# Patient Record
Sex: Female | Born: 1990
Health system: Southern US, Community
[De-identification: ages and names within clinical notes are randomized; demographics above are authoritative.]

## PROBLEM LIST (undated history)

## (undated) DIAGNOSIS — R112 Nausea with vomiting, unspecified: Secondary | ICD-10-CM

## (undated) DIAGNOSIS — Z889 Allergy status to unspecified drugs, medicaments and biological substances status: Secondary | ICD-10-CM

## (undated) DIAGNOSIS — R519 Headache, unspecified: Secondary | ICD-10-CM

## (undated) DIAGNOSIS — H9190 Unspecified hearing loss, unspecified ear: Secondary | ICD-10-CM

## (undated) DIAGNOSIS — Q754 Mandibulofacial dysostosis: Secondary | ICD-10-CM

## (undated) DIAGNOSIS — Z9889 Other specified postprocedural states: Secondary | ICD-10-CM

## (undated) HISTORY — PX: EXTERNAL EAR SURGERY: SHX627

## (undated) HISTORY — PX: OTHER SURGICAL HISTORY: SHX169

---
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
## 2008-12-09 ENCOUNTER — Emergency Department (HOSPITAL_COMMUNITY): Admission: EM | Admit: 2008-12-09 | Discharge: 2008-12-09 | Payer: Self-pay | Admitting: Emergency Medicine

## 2009-02-14 ENCOUNTER — Emergency Department (HOSPITAL_COMMUNITY): Admission: EM | Admit: 2009-02-14 | Discharge: 2009-02-14 | Payer: Self-pay | Admitting: Emergency Medicine

## 2009-06-20 ENCOUNTER — Emergency Department (HOSPITAL_COMMUNITY): Admission: EM | Admit: 2009-06-20 | Discharge: 2009-06-20 | Payer: Self-pay | Admitting: Family Medicine

## 2009-07-24 IMAGING — CR DG MASTOID 3+V
4 series · 4 of 4 positions shown · non-contrast
Comparison: none

CLINICAL DATA: Cochlear implant on 04/30/07. Skin has grown over implant.  Warm to touch with redness and swelling.
 MASTOID ? 3 VIEW:

[view not recorded (1 of 4)]
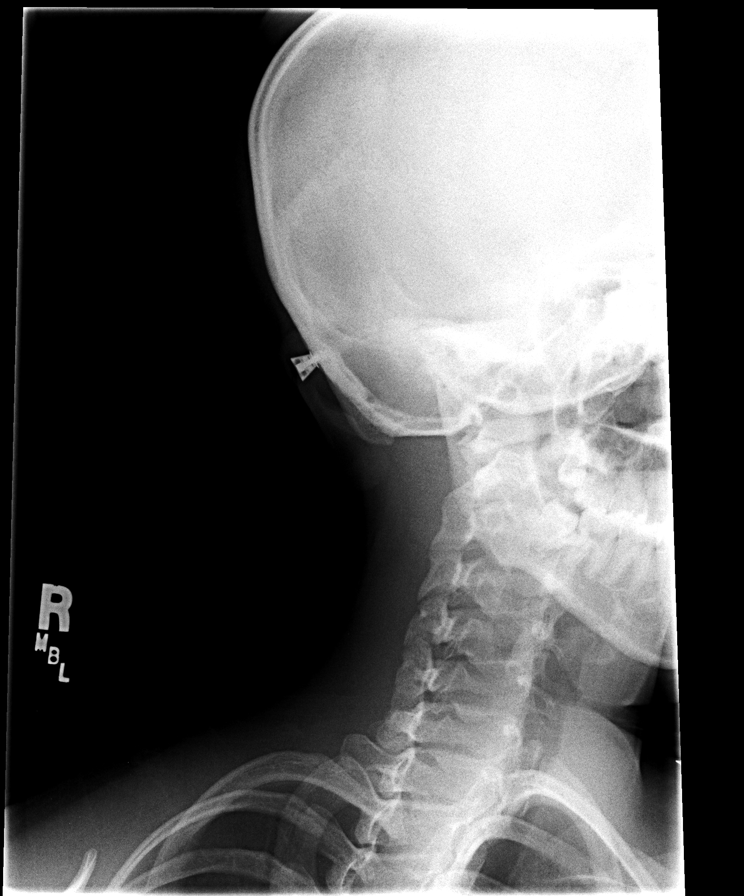

[view not recorded (2 of 4)]
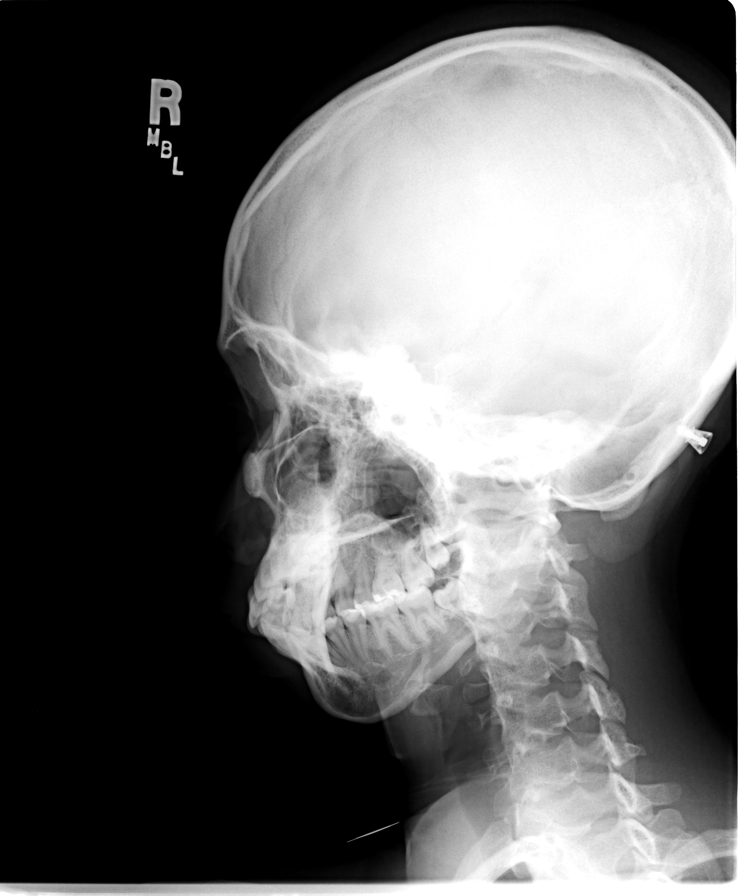

[view not recorded (3 of 4)]
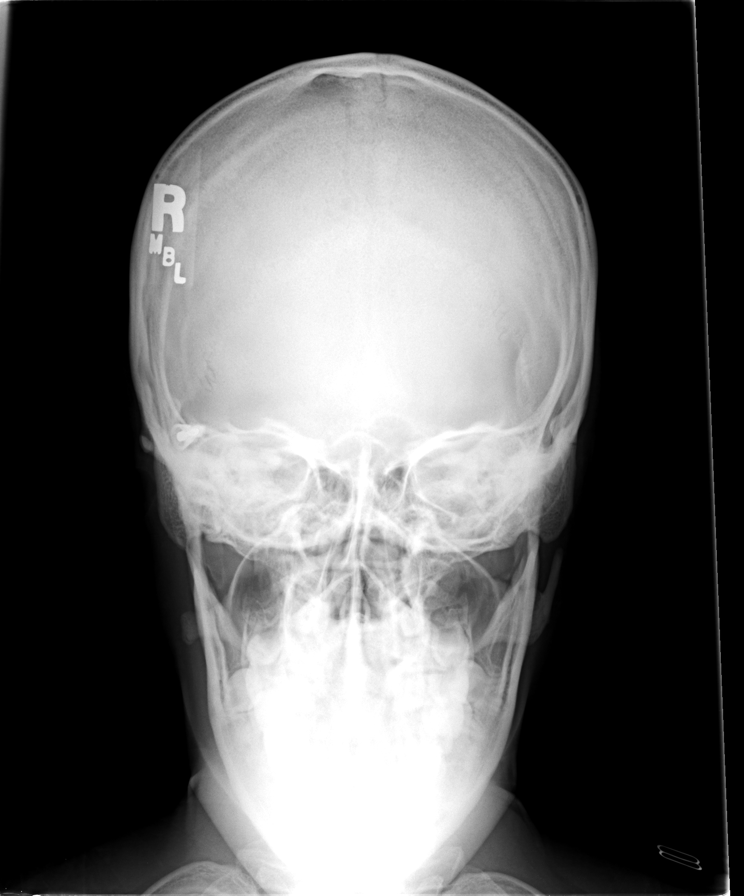

[view not recorded (4 of 4)]
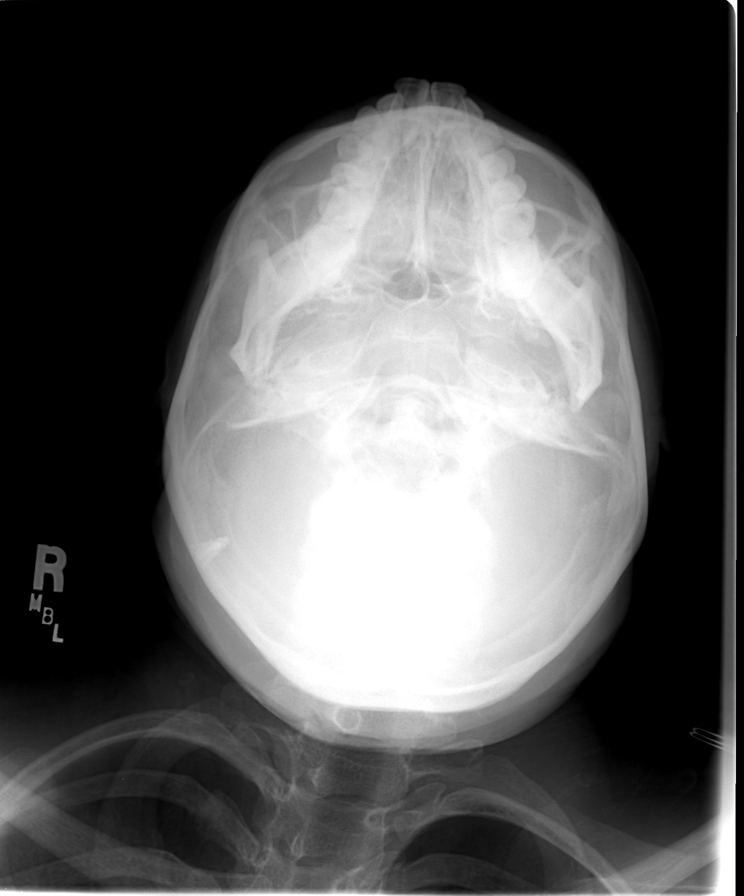

[4 of 4 positions shown; findings below may reference images not displayed]

FINDINGS: A cochlear implant is seen on the right.  No definite osseous erosion to suggest osteomyelitis.
IMPRESSION: Please see above.

## 2010-11-06 ENCOUNTER — Emergency Department (HOSPITAL_COMMUNITY)
Admission: EM | Admit: 2010-11-06 | Discharge: 2010-11-06 | Payer: Self-pay | Source: Home / Self Care | Admitting: Emergency Medicine

## 2011-03-09 LAB — POCT PREGNANCY, URINE: Preg Test, Ur: NEGATIVE

## 2011-03-09 LAB — POCT URINALYSIS DIP (DEVICE)
Protein, ur: NEGATIVE mg/dL
Specific Gravity, Urine: 1.02 (ref 1.005–1.030)
Urobilinogen, UA: 0.2 mg/dL (ref 0.0–1.0)
pH: 5.5 (ref 5.0–8.0)

## 2011-12-29 ENCOUNTER — Other Ambulatory Visit: Payer: Self-pay | Admitting: Specialist

## 2011-12-29 ENCOUNTER — Other Ambulatory Visit: Payer: Self-pay | Admitting: Family Medicine

## 2011-12-29 DIAGNOSIS — R102 Pelvic and perineal pain: Secondary | ICD-10-CM

## 2011-12-29 DIAGNOSIS — N946 Dysmenorrhea, unspecified: Secondary | ICD-10-CM

## 2012-01-02 ENCOUNTER — Ambulatory Visit
Admission: RE | Admit: 2012-01-02 | Discharge: 2012-01-02 | Disposition: A | Discharge status: Home / Self Care | Payer: Medicaid Other | Source: Ambulatory Visit | Attending: Specialist | Admitting: Specialist

## 2012-01-02 ENCOUNTER — Ambulatory Visit: Admission: RE | Admit: 2012-01-02 | Payer: Medicaid Other | Source: Ambulatory Visit

## 2012-01-02 DIAGNOSIS — N946 Dysmenorrhea, unspecified: Secondary | ICD-10-CM

## 2012-01-02 DIAGNOSIS — R102 Pelvic and perineal pain: Secondary | ICD-10-CM

## 2014-01-02 IMAGING — US US PELVIS COMPLETE
1 series · 14 of 25 positions shown · non-contrast
Comparison: None.

CLINICAL DATA: Pelvic pain and dysmenorrhea.  A transabdominal
evaluation only was performed as the patient is not sexually
active. LMP 12/07/2011

TRANSABDOMINAL ULTRASOUND OF PELVIS
TECHNIQUE: Transabdominal ultrasound examination of the pelvis was
performed including evaluation of the uterus, ovaries, adnexal
regions, and pelvic cul-de-sac.

[Series 1: us pelvis complete · 0.28mm/px · 14 of 27 slices shown]
[im 1/27]
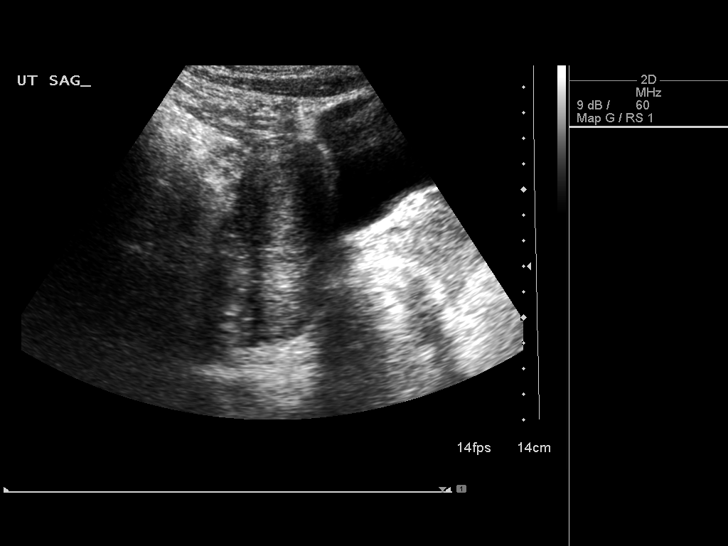
[im 3/27]
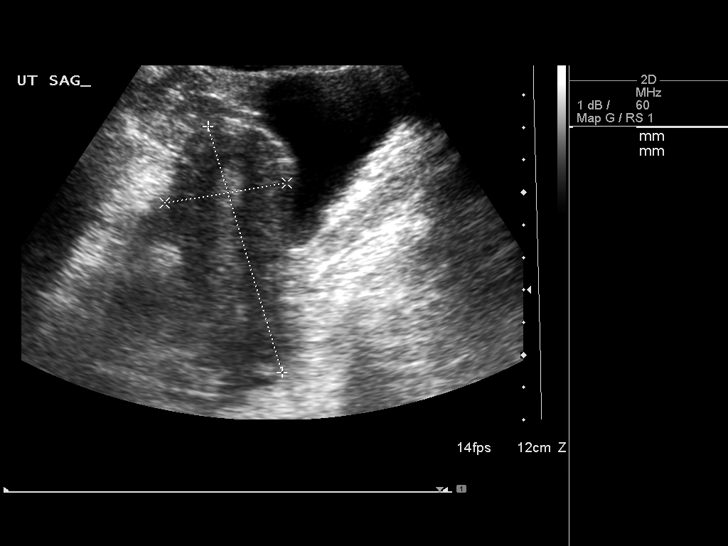
[im 5/27]
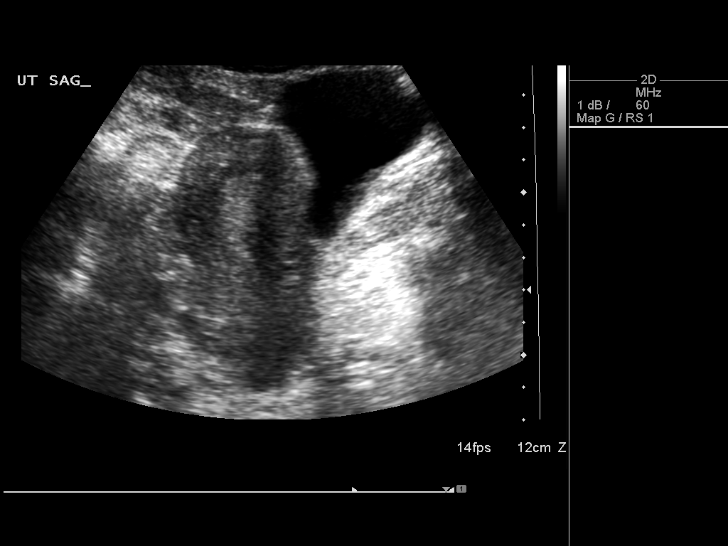
[im 7/27]
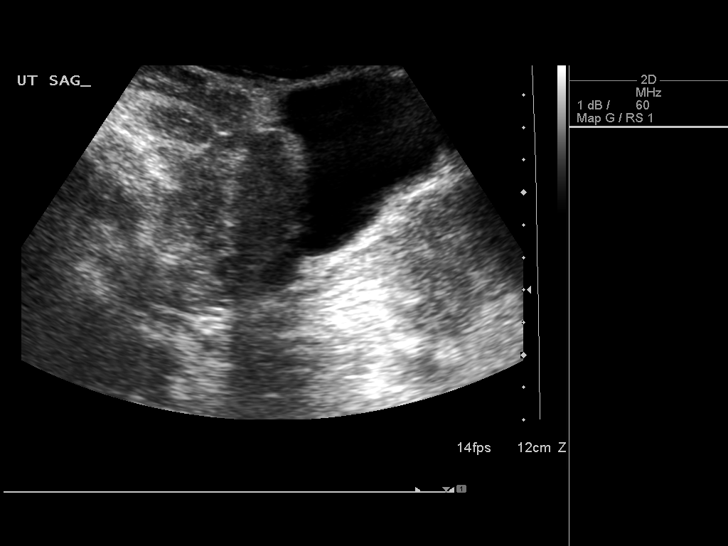
[im 9/27]
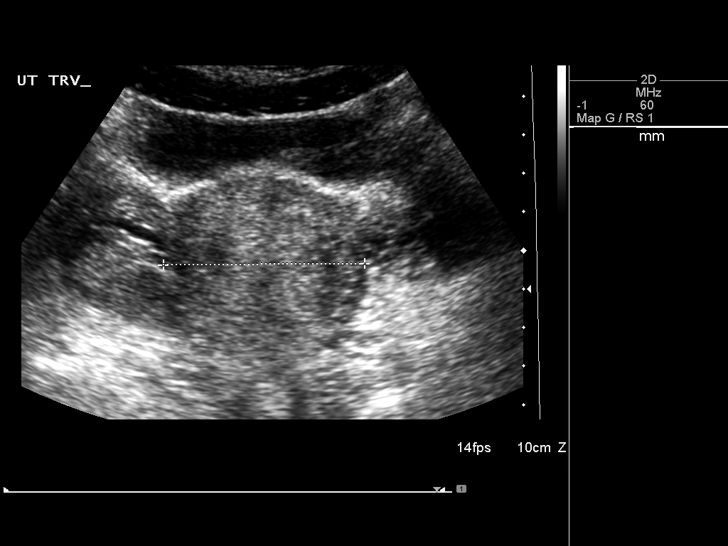
[im 10/27]
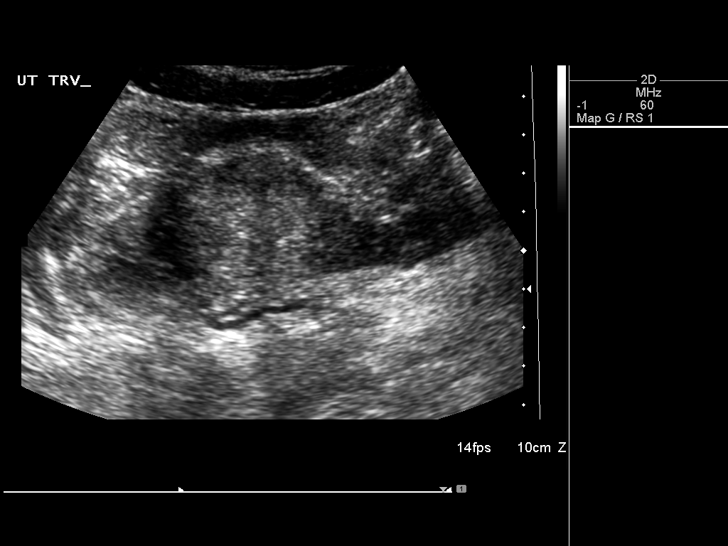
[im 12/27]
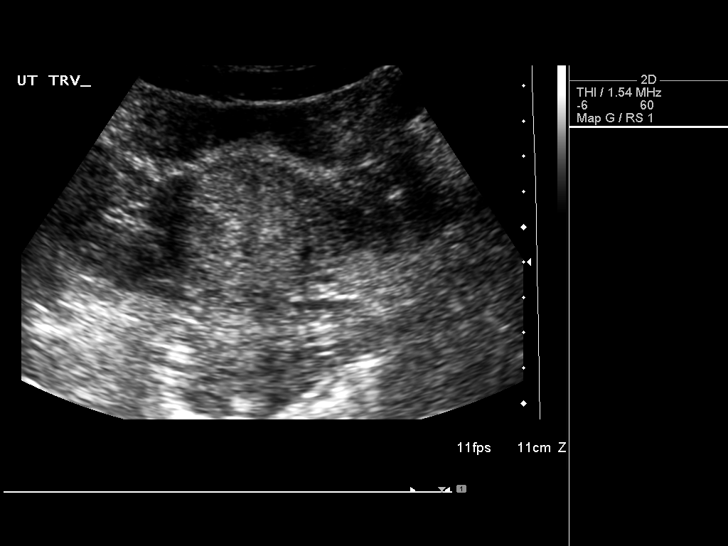
[im 15/27]
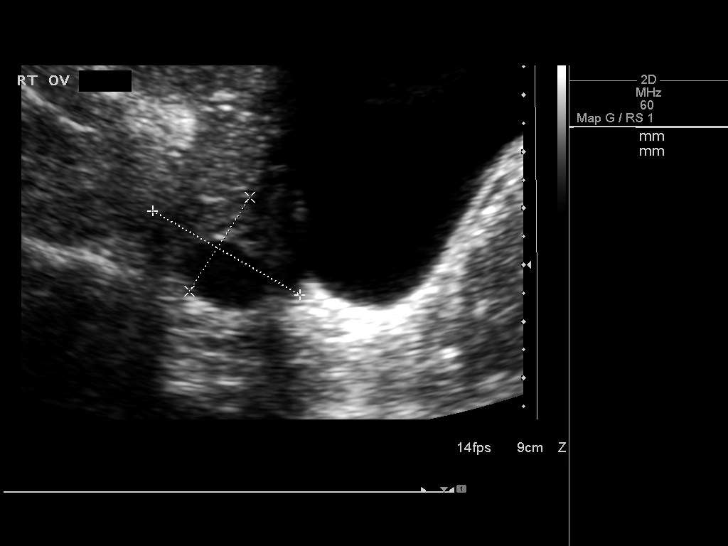
[im 17/27]
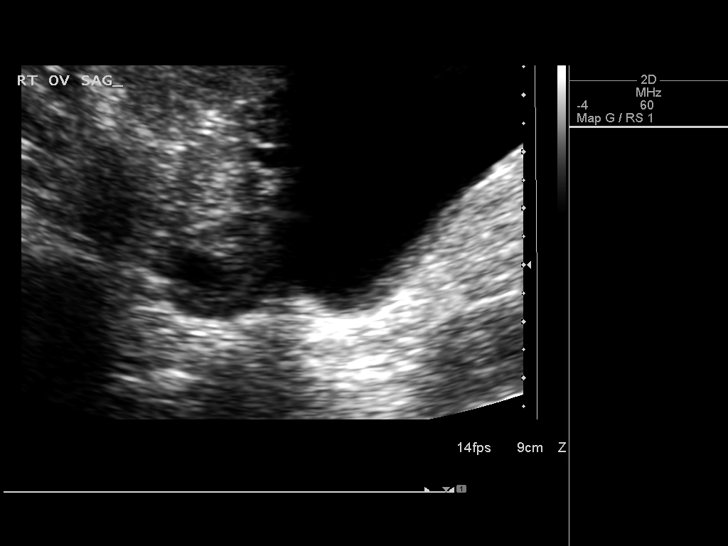
[im 18/27]
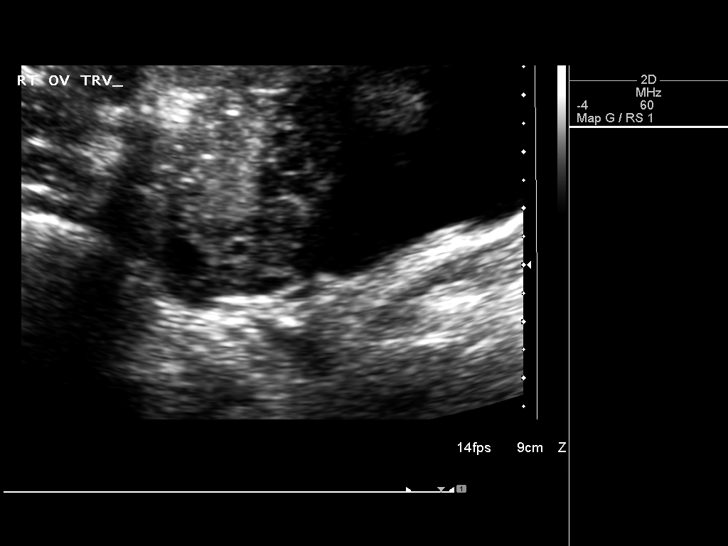
[im 20/27]
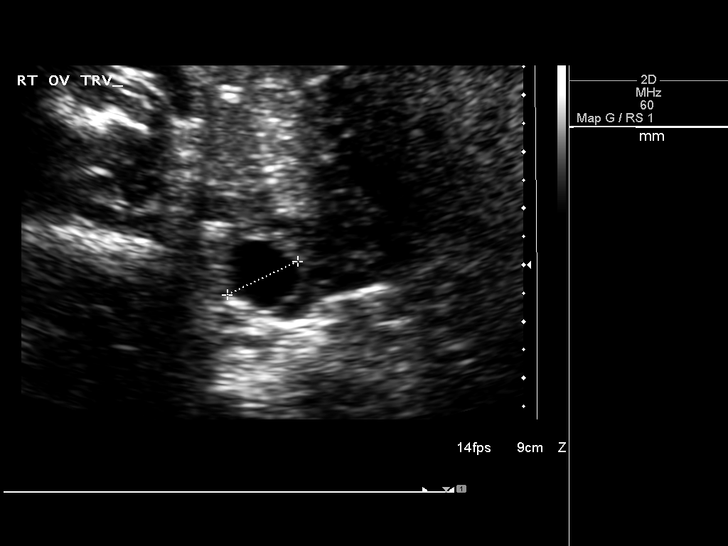
[im 22/27]
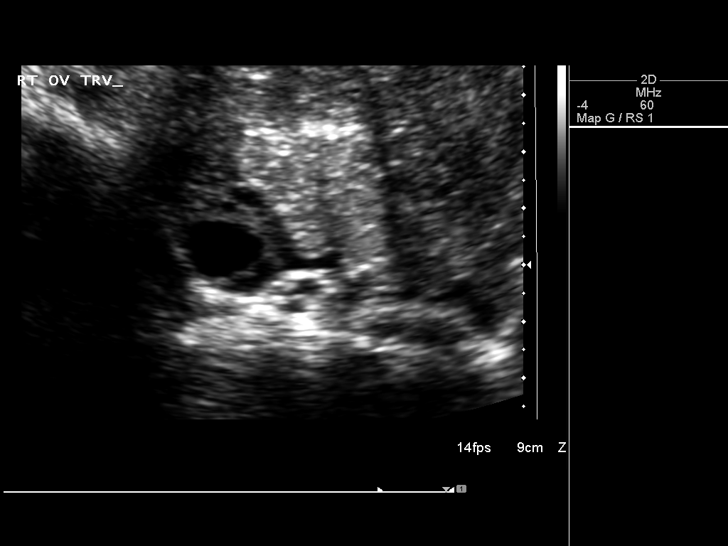
[im 24/27]
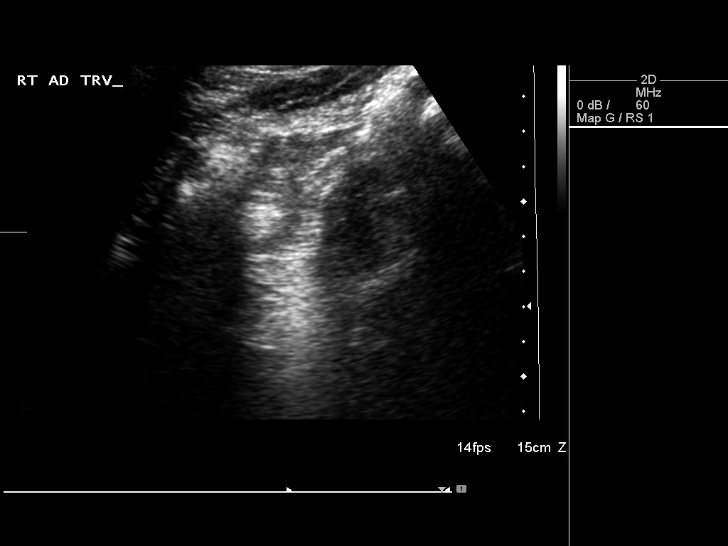
[im 27/27]
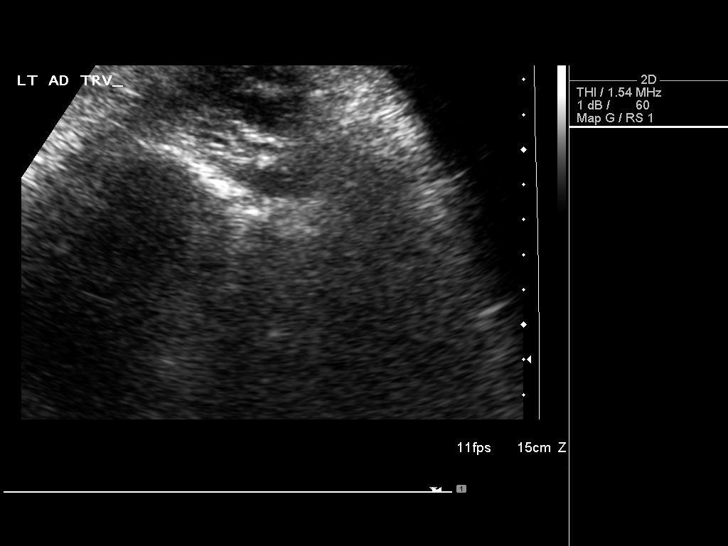

[14 of 25 positions shown; findings below may reference images not displayed]

FINDINGS: Uterus:  Demonstrates a sagittal length of 7.9 cm, depth of 3.8 cm
and a transverse width of 5.2 cm.  No definite mural abnormalities
are identified

Endometrium: Is homogeneously echogenic with an AP width of
mm.  No definite areas of focal thickening are seen.  The
transitional zone appears maintained.  The appearance would
correlate with a presecretory endometrial stripe and correspond
with the patient's given LMP of 12/07/2011.

Right ovary: Measures 3.0 x 2.0 x 1.5 cm and has a normal
appearance

Left ovary: Is not seen with confidence either transabdominally or
endovaginally

Other Findings:  No pelvic fluid is seen.
IMPRESSION: Non-visualized left ovary.  Otherwise unremarkable transabdominal
evaluation with no definite focal uterine or right ovarian
abnormality seen.

## 2014-07-29 ENCOUNTER — Encounter (HOSPITAL_COMMUNITY): Payer: Self-pay | Admitting: Emergency Medicine

## 2014-07-29 ENCOUNTER — Emergency Department (INDEPENDENT_AMBULATORY_CARE_PROVIDER_SITE_OTHER)
Admission: EM | Admit: 2014-07-29 | Discharge: 2014-07-29 | Disposition: A | Discharge status: Home / Self Care | Payer: Medicaid Other | Source: Home / Self Care | Attending: Emergency Medicine | Admitting: Emergency Medicine

## 2014-07-29 DIAGNOSIS — H9201 Otalgia, right ear: Secondary | ICD-10-CM

## 2014-07-29 DIAGNOSIS — H9209 Otalgia, unspecified ear: Secondary | ICD-10-CM

## 2014-07-29 HISTORY — DX: Mandibulofacial dysostosis: Q75.4

## 2014-07-29 MED ORDER — CEPHALEXIN 500 MG PO CAPS: 500.0000 mg | ORAL_CAPSULE | Freq: Three times a day (TID) | ORAL | Status: DC

## 2014-07-29 NOTE — ED Provider Notes (Signed)
Chief Complaint   Chief Complaint  Patient presents with  . 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 She had not had any problems with this up until this past Sunday, 4 days ago, when she began to have pain and swelling around a cochlear implant site. There was no purulent drainage. She denies any fever, headache, chills, sweats, stiff neck, or facial pain.  Review of Systems   Other than as noted above, the patient denies any of the following symptoms: Systemic:  No fevers or chills. Eye:  No redness, pain, discharge, itching, blurred vision, or diplopia. ENT:  No headache, nasal congestion, sneezing, itching, epistaxis, ear pain, decreased hearing, ringing in ears, vertigo, or tinnitus.  No oral lesions, sore throat, or hoarseness. Neck:  No neck pain or adenopathy. Skin:  No rash or itching.  [place]   Past medical history, family history, social history, meds, and allergies were reviewed. Her only medication is Toviaz for bladder control.  Physical Examination     Vital signs:  BP 120/70  Pulse 72  Temp(Src) 97.6 F (36.4 C) (Oral)  Resp 18  SpO2 100%  LMP 07/25/2014 General:  Alert and oriented.  In no distress.  Skin warm and dry. Eye:  PERRL, full EOMs, lids and conjunctiva normal.   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  Nasal mucosa not congested and without drainage.  Mucous membranes moist, no oral lesions, normal dentition, pharynx clear.  No cranial or facial pain to palplation. There is no pain or swelling over the mastoid. She has a cochlear implant site over the mastoid. There is a metal port visible. Surrounding this there is a 2-3 mm rim of raised skin which may be scar tissue or inflamed tissue. There was no purulent drainage. This was only minimally tender to  palpation. Neck:  Supple, full ROM.  No adenopathy, tenderness or mass.  Thyroid normal. Lungs:  Breath sounds clear and equal bilaterally.  No wheezes, rales or rhonchi. Heart:  Rhythm regular, without extrasystoles.  No gallops or murmers. Skin:  Clear, warm and dry.   Assessment   The encounter diagnosis was Otalgia, right.  I was able to get in touch with the ear nose and throat Department at [facility]. He thought that this might be a mild cellulitis surrounding her cochlear implant port. He suggested antibiotics and they will arrange for followup there at the clinic.  Plan    1.  Meds:  The following meds were prescribed:   Discharge Medication List as of 07/29/2014  9:17 AM    START taking these medications   Details  cephALEXin (KEFLEX) 500 MG capsule Take 1 capsule (500 mg total) by mouth 3 (three) times daily., Starting 07/29/2014, Until Discontinued, Normal        2.  Patient Education/Counseling:  The patient was given appropriate handouts, self care instructions, and instructed in symptomatic relief.    3.  Follow up:  The patient was told to follow up here if no better in 3 to 4 days, or sooner if becoming worse in any way, and given some red flag symptoms such as fever, increasing swelling, or purulent drainage which would prompt immediate return.  Followup at [facility] clinic.     Shanon Brow  Collier Bullock, MD 07/29/14 [phone]

## 2014-07-29 NOTE — ED Notes (Signed)
Pt  Reports   Ear       Problem      Pain  And  Swollen       Behind     r   Ear          Pt   Has   Some  Type  Of      Ear implant   Which  She has had  For  About  8  Years

## 2014-07-29 NOTE — Discharge Instructions (Signed)
Follow up at [facility].

## 2014-08-20 ENCOUNTER — Emergency Department (HOSPITAL_COMMUNITY)
Admission: EM | Admit: 2014-08-20 | Discharge: 2014-08-20 | Disposition: A | Discharge status: Home / Self Care | Payer: Medicaid Other | Source: Home / Self Care | Attending: Family Medicine | Admitting: Family Medicine

## 2014-08-20 ENCOUNTER — Encounter (HOSPITAL_COMMUNITY): Payer: Self-pay | Admitting: Emergency Medicine

## 2014-08-20 DIAGNOSIS — J309 Allergic rhinitis, unspecified: Secondary | ICD-10-CM

## 2014-08-20 MED ORDER — FLUTICASONE PROPIONATE 50 MCG/ACT NA SUSP: 2.0000 | Freq: Every day | NASAL | Status: DC

## 2014-08-20 NOTE — Discharge Instructions (Signed)
Thank you for coming in today. Use Flonase nasal spray Come back as needed   Allergic Rhinitis Allergic rhinitis is when the mucous membranes in the nose respond to allergens. Allergens are particles in the air that cause your body to have an allergic reaction. This causes you to release allergic antibodies. Through a chain of events, these eventually cause you to release histamine into the blood stream. Although meant to protect the body, it is this release of histamine that causes your discomfort, such as frequent sneezing, congestion, and an itchy, runny nose.  CAUSES  Seasonal allergic rhinitis (hay fever) is caused by pollen allergens that may come from grasses, trees, and weeds. Year-round allergic rhinitis (perennial allergic rhinitis) is caused by allergens such as house dust mites, pet dander, and mold spores.  SYMPTOMS   Nasal stuffiness (congestion).  Itchy, runny nose with sneezing and tearing of the eyes. DIAGNOSIS  Your health care provider can help you determine the allergen or allergens that trigger your symptoms. If you and your health care provider are unable to determine the allergen, skin or blood testing may be used. TREATMENT  Allergic rhinitis does not have a cure, but it can be controlled by:  Medicines and allergy shots (immunotherapy).  Avoiding the allergen. Hay fever may often be treated with antihistamines in pill or nasal spray forms. Antihistamines block the effects of histamine. There are over-the-counter medicines that may help with nasal congestion and swelling around the eyes. Check with your health care provider before taking or giving this medicine.  If avoiding the allergen or the medicine prescribed do not work, there are many new medicines your health care provider can prescribe. Stronger medicine may be used if initial measures are ineffective. Desensitizing injections can be used if medicine and avoidance does not work. Desensitization is when a  patient is given ongoing shots until the body becomes less sensitive to the allergen. Make sure you follow up with your health care provider if problems continue. HOME CARE INSTRUCTIONS It is not possible to completely avoid allergens, but you can reduce your symptoms by taking steps to limit your exposure to them. It helps to know exactly what you are allergic to so that you can avoid your specific triggers. SEEK MEDICAL CARE IF:   You have a fever.  You develop a cough that does not stop easily (persistent).  You have shortness of breath.  You start wheezing.  Symptoms interfere with normal daily activities. Document Released: 08/08/2001 Document Revised: 11/18/2013 Document Reviewed: 07/21/2013 [facility] Patient Information 2015 [place]. This information is not intended to replace advice given to you by your health care provider. Make sure you discuss any questions you have with your health care provider.

## 2014-08-20 NOTE — ED Notes (Signed)
C/o  Irritation on the outside of the nose.  States constantly itching.  Pain on inside of nose.   No otc med tried.  Symptoms present x 1 wk.

## 2014-08-20 NOTE — ED Provider Notes (Signed)
Caitlin Banks is a 23 y.o. female who presents to Urgent Care today for nose itching. Patient has a one week history of itchy nose. She notes some runny nose as well. She has not tried any medications yet. No fevers or chills nausea vomiting or diarrhea. Patient has a medical history significant for Treacher Collins syndrome. She has absent ear canals bilaterally and a right-sided cochlear implant.    Past Medical History  Diagnosis Date  . Treacher Collins syndrome    History  Substance Use Topics  . Smoking status: Never Smoker   . Smokeless tobacco: Not on file  . Alcohol Use: No   ROS as above Medications: No current facility-administered medications for this encounter.   Current Outpatient Prescriptions  Medication Sig Dispense Refill  . cephALEXin (KEFLEX) 500 MG capsule Take 1 capsule (500 mg total) by mouth 3 (three) times daily.  30 capsule  0  . fluticasone (FLONASE) 50 MCG/ACT nasal spray Place 2 sprays into both nostrils daily.  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 Nose is normal-appearing with no significant discharge. Mild nasal turbinate erythema is present. Nontender maxillary sinus area.  Lungs: Normal work of breathing. CTABL Heart: RRR no MRG Abd: NABS, Soft. Nondistended, Nontender Exts: Brisk capillary refill, warm and well perfused.   No results found for this or any previous visit (from the past 24 hour(s)). No results found.  Assessment and Plan: 23 y.o. female with allergic rhinitis with likely explanation. Plan to treat with Flonase nasal spray.  Discussed warning signs or symptoms. Please see discharge instructions. Patient expresses understanding.     Gregor Hams, MD 08/20/14 [phone]

## 2016-09-08 ENCOUNTER — Encounter (HOSPITAL_COMMUNITY): Payer: Self-pay | Admitting: Emergency Medicine

## 2016-09-08 ENCOUNTER — Ambulatory Visit (HOSPITAL_COMMUNITY)
Admission: EM | Admit: 2016-09-08 | Discharge: 2016-09-08 | Disposition: A | Discharge status: Home / Self Care | Payer: Medicaid Other | Attending: Emergency Medicine | Admitting: Emergency Medicine

## 2016-09-08 DIAGNOSIS — J3489 Other specified disorders of nose and nasal sinuses: Secondary | ICD-10-CM | POA: Diagnosis not present

## 2016-09-08 MED ORDER — MUPIROCIN CALCIUM 2 % NA OINT: TOPICAL_OINTMENT | NASAL | 0 refills | Status: DC

## 2016-09-08 NOTE — ED Provider Notes (Signed)
CSN: [id number]     Arrival date & time 09/08/16  1449 History   None    Chief Complaint  Patient presents with  . URI   (Consider location/radiation/quality/duration/timing/severity/associated sxs/prior Treatment) 25 yr old AA female presents to Er with cc of left nare sore"for some time". Pt has treacher collins syndrome.    The history is provided by the patient.  URI  Presenting symptoms: congestion   Severity:  Mild Onset quality:  Gradual Timing:  Constant Progression:  Unchanged Chronicity:  Recurrent Relieved by:  Nothing Exacerbated by: palpation. Ineffective treatments:  None tried Risk factors: no sick contacts     Past Medical History:  Diagnosis Date  . Treacher Collins syndrome    Past Surgical History:  Procedure Laterality Date  . EXTERNAL EAR SURGERY     History reviewed. No pertinent family history. Social History  Substance Use Topics  . Smoking status: Never Smoker  . Smokeless tobacco: Never Used  . Alcohol use No   OB History    No data available     Review of Systems  Constitutional: Negative.   HENT: Positive for congestion.   Eyes: Negative.   Respiratory: Negative.   Cardiovascular: Negative.   Gastrointestinal: Negative.   Endocrine: Negative.   Genitourinary: Negative.   Musculoskeletal: Negative.   Skin: Negative.   Allergic/Immunologic: Negative.   Neurological: Negative.   Hematological: Negative.   Psychiatric/Behavioral: Negative.   All other systems reviewed and are negative.   Allergies  Review of patient's allergies indicates no known allergies.  Home Medications   Prior to Admission medications   Medication Sig Start Date End Date Taking? Authorizing Provider  cephALEXin (KEFLEX) 500 MG capsule Take 1 capsule (500 mg total) by mouth 3 (three) times daily. 07/29/14   Harden Mo, MD  fluticasone (FLONASE) 50 MCG/ACT nasal spray Place 2 sprays into both nostrils daily. 08/20/14   Gregor Hams, MD  mupirocin  nasal ointment (BACTROBAN) 2 % Apply in each nostril daily x 2 weeks [id number]   Tori Milks, NP   Meds Ordered and Administered this Visit  Medications - No data to display  BP 111/63 (BP Location: Left Arm)   Pulse 60   Temp 98.2 F (36.8 C) (Oral)   Resp 12   LMP 09/08/2016   SpO2 100%  No data found.   Physical Exam  Constitutional: She appears well-nourished. She is active and cooperative. No distress.  HENT:  Nose: Mucosal edema present.  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   Pt has sore to left lateral nare, +TTP  Eyes: Conjunctivae, EOM and lids are normal. Pupils are equal, round, and reactive to light.  Neck: Trachea normal.  Cardiovascular: Normal rate.   Pulmonary/Chest: Effort normal.  Musculoskeletal: Normal range of motion.  Neurological: She is alert. GCS eye subscore is 4. GCS verbal subscore is 5. GCS motor subscore is 6.  Skin: Skin is warm and dry.  Psychiatric: She has a normal mood and affect. Her behavior is normal. Judgment and thought content normal.  Nursing note and vitals reviewed.   Urgent Care Course   Clinical Course    Procedures (including critical care time)  Labs Review Labs Reviewed - No data to display  Imaging Review No results found.      MDM   1. Sore in nose    Discussed plan of care with pt: You have a nasal sore. Use bacitracin nasal daily x 2 weeks.  Take daily OTC allergy med. Follow up with your PCP on insurance card-call for appt. May need referral to ENT if symptoms persist. Return to UC as needed.    Tori Milks, NP [id number]

## 2016-09-08 NOTE — Discharge Instructions (Signed)
You have a nasal sore. Use bacitracin nasal daily x 2 weeks. Take daily OTC allergy med. Follow up with your PCP on insurance card-call for appt. May need referral to ENT if symptoms persist. Return to UC as needed.

## 2016-09-08 NOTE — ED Triage Notes (Signed)
Pt c/o cold sx onset 1 week associated w/nasal congestion, sneezing, swelling/itching of nose  Denies fevers, chills  A&O x4.... NAD

## 2016-10-02 ENCOUNTER — Telehealth (HOSPITAL_COMMUNITY): Payer: Self-pay | Admitting: Emergency Medicine

## 2016-10-02 NOTE — Telephone Encounter (Signed)
Pt calling UC because a Rx that was written for her 24 days ago was apparently denied by her insurance company.  Pt was wanting a new Rx.  She states her issue that she was seen for is a little better.  I explained to her that she would need to be reevaluated before we could prescribe her any medication because it had been too long since she was last seen.  Pt stated understanding and said she would come here to be seen.

## 2016-10-09 ENCOUNTER — Ambulatory Visit (HOSPITAL_COMMUNITY)
Admission: EM | Admit: 2016-10-09 | Discharge: 2016-10-09 | Disposition: A | Discharge status: Home / Self Care | Payer: Medicaid Other | Attending: Emergency Medicine | Admitting: Emergency Medicine

## 2016-10-09 ENCOUNTER — Encounter (HOSPITAL_COMMUNITY): Payer: Self-pay | Admitting: Emergency Medicine

## 2016-10-09 DIAGNOSIS — Z760 Encounter for issue of repeat prescription: Secondary | ICD-10-CM

## 2016-10-09 DIAGNOSIS — N898 Other specified noninflammatory disorders of vagina: Secondary | ICD-10-CM

## 2016-10-09 LAB — POCT URINALYSIS DIP (DEVICE)
BILIRUBIN URINE: NEGATIVE
GLUCOSE, UA: NEGATIVE mg/dL
KETONES UR: NEGATIVE mg/dL
LEUKOCYTES UA: NEGATIVE
Nitrite: NEGATIVE
Protein, ur: NEGATIVE mg/dL
SPECIFIC GRAVITY, URINE: 1.02 (ref 1.005–1.030)
Urobilinogen, UA: 0.2 mg/dL (ref 0.0–1.0)
pH: 7 (ref 5.0–8.0)

## 2016-10-09 LAB — POCT PREGNANCY, URINE: Preg Test, Ur: NEGATIVE

## 2016-10-09 MED ORDER — CEPHALEXIN 500 MG PO CAPS: 500.0000 mg | ORAL_CAPSULE | Freq: Four times a day (QID) | ORAL | 0 refills | Status: DC

## 2016-10-09 MED ORDER — IBUPROFEN 800 MG PO TABS: 800.0000 mg | ORAL_TABLET | Freq: Three times a day (TID) | ORAL | 0 refills | Status: DC

## 2016-10-09 NOTE — ED Provider Notes (Signed)
CSN: [id number]     Arrival date & time 10/09/16  1447 History   None    Chief Complaint  Patient presents with  . 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 Due to her congenital condition and she has a decrease in hearing and problems with speech. She thinks that she has a UTI based on pelvic pain and a vaginal discharge. She was requesting an antibiotic. Her second complaint was that of ibuprofen 800 mg prescription because she did not want to pay for it over-the-counter, she is requesting a prescription for bacitracin for the same reason. She later wrote on a piece of paper that she had a yeast infection associated with pelvic pain for approximately one week. She is requesting Keflex for a surgical site behind her right ear. She was here recently received a prescription for cephalexin presumptively for sore in the nose. Urinalysis is negative except for small amount of blood only. She shows a physician's business card however because the office mood she is no longer seeing this provider.      Past Medical History:  Diagnosis Date  . Treacher Collins syndrome    Past Surgical History:  Procedure Laterality Date  . EXTERNAL EAR SURGERY     No family history on file. Social History  Substance Use Topics  . Smoking status: Never Smoker  . Smokeless tobacco: Never Used  . Alcohol use No   OB History    No data available     Review of Systems  Constitutional: Negative for activity change, fatigue and fever.  HENT: Negative for congestion and facial swelling.        There is craniofacial deformity involving the right ear. There is an implant for which she attaches a hearing aid. She points to this area as a potential source of infection is requesting the antibiotic.  Respiratory: Negative.   Gastrointestinal: Negative.    Genitourinary: Positive for frequency, pelvic pain and vaginal discharge.  Musculoskeletal: Negative.     Allergies  Patient has no known allergies.  Home Medications   Prior to Admission medications   Medication Sig Start Date End Date Taking? Authorizing Provider  cephALEXin (KEFLEX) 500 MG capsule Take 1 capsule (500 mg total) by mouth 4 (four) times daily. 10/09/16   Janne Napoleon, NP  fluticasone (FLONASE) 50 MCG/ACT nasal spray Place 2 sprays into both nostrils daily. Patient not taking: Reported on 10/09/2016 08/20/14   Gregor Hams, MD  ibuprofen (ADVIL,MOTRIN) 800 MG tablet Take 1 tablet (800 mg total) by mouth 3 (three) times daily. Prn pain. Additional refills from you PCP only. 10/09/16   Janne Napoleon, NP  mupirocin nasal ointment (BACTROBAN) 2 % Apply in each nostril daily x 2 weeks Patient not taking: Reported on 10/09/2016 [id number]   Tori Milks, NP   Meds Ordered and Administered this Visit  Medications - No data to display  BP 125/58 (BP Location: Left Arm)   Pulse 76   Temp 97.9 F (36.6 C) (Oral)   Resp 16   LMP 10/05/2016   SpO2 100%  No data found.   Physical Exam  Constitutional: She appears well-developed and well-nourished. No distress.  HENT:  Mouth/Throat: Oropharynx is clear and moist.  Deformity of the external ears. Observation of the OpSite for which the external hearing devices attached does not appear to be infected this time. No erythema or swelling. The patient is unable to explain further why  she needs the antibiotic making the assumption that she takes it occasionally when she feels like she might be getting an infection.  Neck: Normal range of motion. Neck supple.  Cardiovascular: Normal rate.   Pulmonary/Chest: Effort normal.  Lymphadenopathy:    She has no cervical adenopathy.  Neurological: She is alert.  Skin: Skin is warm and dry.  Nursing note and vitals reviewed.   Urgent Care Course   Clinical Course     Procedures  (including critical care time)  Labs Review Labs Reviewed  POCT URINALYSIS DIP (DEVICE) - Abnormal; Notable for the following:       Result Value   Hgb urine dipstick MODERATE (*)    All other components within normal limits  POCT PREGNANCY, URINE    Imaging Review No results found.   Visual Acuity Review  Right Eye Distance:   Left Eye Distance:   Bilateral Distance:    Right Eye Near:   Left Eye Near:    Bilateral Near:         MDM   1. Vaginal discharge   2. Encounter for medication refill    If you believe that you may have a yeast infection obtain Monistat vaginal cream and used nightly for 7 nights. You will receive one more prescription for cefalexin however, additional request for prophylactic antibiotics must be made with your primary care provider. Prescriptions for OTC medications will also need to come from your primary care provider. Meds ordered this encounter  Medications  . cephALEXin (KEFLEX) 500 MG capsule    Sig: Take 1 capsule (500 mg total) by mouth 4 (four) times daily.    Dispense:  28 capsule    Refill:  0    Order Specific Question:   Supervising Provider    Answer:   Melynda Ripple [[id number]]  . ibuprofen (ADVIL,MOTRIN) 800 MG tablet    Sig: Take 1 tablet (800 mg total) by mouth 3 (three) times daily. Prn pain. Additional refills from you PCP only.    Dispense:  20 tablet    Refill:  0    Order Specific Question:   Supervising Provider    Answer:   Melynda Ripple [[id number]]       Janne Napoleon, NP 10/09/16 1736

## 2016-10-09 NOTE — ED Triage Notes (Signed)
One week history of having pain with urination and using toilet frequently.    Patient reports not getting bacitracin at a recent visit-says pharmacy did not have it .  Patient also requesting ibuprofen prescription

## 2016-10-09 NOTE — Discharge Instructions (Signed)
If you believe that you may have a yeast infection obtain Monistat vaginal cream and used nightly for 7 nights. You will receive one more prescription for cefalexin however, additional request for prophylactic antibiotics must be made with your primary care provider. Prescriptions for OTC medications will also need to come from your primary care provider.

## 2017-03-12 ENCOUNTER — Encounter: Payer: Self-pay | Admitting: Family Medicine

## 2017-03-12 ENCOUNTER — Ambulatory Visit: Payer: Medicaid Other | Attending: Family Medicine | Admitting: Family Medicine

## 2017-03-12 VITALS — BP 102/68 | HR 70 | Temp 97.7°F | Resp 18 | Ht 66.0 in | Wt 123.2 lb

## 2017-03-12 DIAGNOSIS — L309 Dermatitis, unspecified: Secondary | ICD-10-CM | POA: Diagnosis not present

## 2017-03-12 DIAGNOSIS — Z79899 Other long term (current) drug therapy: Secondary | ICD-10-CM | POA: Diagnosis not present

## 2017-03-12 DIAGNOSIS — Z23 Encounter for immunization: Secondary | ICD-10-CM | POA: Diagnosis not present

## 2017-03-12 DIAGNOSIS — Z0000 Encounter for general adult medical examination without abnormal findings: Secondary | ICD-10-CM | POA: Diagnosis not present

## 2017-03-12 DIAGNOSIS — J3489 Other specified disorders of nose and nasal sinuses: Secondary | ICD-10-CM | POA: Insufficient documentation

## 2017-03-12 DIAGNOSIS — Q754 Mandibulofacial dysostosis: Secondary | ICD-10-CM | POA: Diagnosis not present

## 2017-03-12 MED ORDER — HYDROCORTISONE 1 % EX CREA: 1.0000 "application " | TOPICAL_CREAM | Freq: Two times a day (BID) | CUTANEOUS | 0 refills | Status: DC | PRN

## 2017-03-12 MED ORDER — TETANUS-DIPHTH-ACELL PERTUSSIS 5-2.5-18.5 LF-MCG/0.5 IM SUSP: 0.5000 mL | Freq: Once | INTRAMUSCULAR | Status: DC

## 2017-03-12 MED ORDER — LORATADINE 10 MG PO TABS: 10.0000 mg | ORAL_TABLET | Freq: Every day | ORAL | 2 refills | Status: DC

## 2017-03-12 MED ORDER — FLUTICASONE PROPIONATE 50 MCG/ACT NA SUSP
2.0000 | Freq: Every day | NASAL | 2 refills | Status: DC
Start: 2017-03-12 — End: 2022-08-29

## 2017-03-12 MED ORDER — MUPIROCIN CALCIUM 2 % NA OINT: TOPICAL_OINTMENT | NASAL | 0 refills | Status: DC

## 2017-03-12 NOTE — Progress Notes (Signed)
Subjective:  Patient ID: Caitlin Banks, female    DOB: Aug 10, 1991  Age: 26 y.o. 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 Recent Audiology appointment. Recommend she follow up  in 1 year. Wears [place] cochlear implant. History of multiple face reconstructive surgeries. Was told at another appointment with provider to establish care with a PCP to be referred to plastic surgery with [facility].    Dermatitis: Reoccurring last episode was in Oct. 2017. Symptoms of itching underneath the nose, rhinorrhea with clear drainage began 1 month ago. Reports use of antibiotic ointment in the past for symptoms but denies taking anything presently.    Outpatient Medications Prior to Visit  Medication Sig Dispense Refill  . ibuprofen (ADVIL,MOTRIN) 800 MG tablet Take 1 tablet (800 mg total) by mouth 3 (three) times daily. Prn pain. Additional refills from you PCP only. 20 tablet 0  . cephALEXin (KEFLEX) 500 MG capsule Take 1 capsule (500 mg total) by mouth 4 (four) times daily. 28 capsule 0  . fluticasone (FLONASE) 50 MCG/ACT nasal spray Place 2 sprays into both nostrils daily. (Patient not taking: Reported on 10/09/2016) 16 g 2  . mupirocin nasal ointment (BACTROBAN) 2 % Apply in each nostril daily x 2 weeks (Patient not taking: Reported on 10/09/2016) 1 g 0   No facility-administered medications prior to visit.     ROS Review of Systems  Constitutional: Negative.   HENT:       History of Treacher's-Collins syndrome.  Respiratory: Negative.   Cardiovascular: Negative.   Skin: Positive for rash.    Objective:  BP 102/68 (BP Location: Left Arm, Patient Position: Sitting, Cuff Size: Normal)   Pulse 70   Temp 97.7 F (36.5 C) (Oral)   Resp 18   Ht 5\' 6"  (1.676 m)   Wt 123 lb 3.2 oz (55.9 kg)   SpO2 100%   BMI 19.89 kg/m   BP/Weight 03/12/2017  10/09/2016 93/73/4287  Systolic BP 681 157 262  Diastolic BP 68 58 63  Wt. (Lbs) 123.2 - -  BMI 19.89 - -     Physical Exam  HENT:  Head: Normocephalic and atraumatic.  Nose: Rhinorrhea (clear) present.  Mouth/Throat: Oropharynx is clear and moist. No oropharyngeal exudate.  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 Cochlear implant device to posterior right ear. Nose: Area of brown skin discoloration underneath nares bilaterally.    Eyes: Conjunctivae are normal. Pupils are equal, round, and reactive to light.  Cardiovascular: Normal rate, regular rhythm, normal heart sounds and intact distal pulses.   Pulmonary/Chest: Effort normal and breath sounds normal.  Abdominal: Soft. Bowel sounds are normal.  Skin: Skin is warm and dry. She is not diaphoretic.  Nursing note and vitals reviewed.   Assessment & Plan:   Problem List Items Addressed This Visit      Musculoskeletal and Integument   Treacher Collins syndrome - Primary   Relevant Orders   Ambulatory referral to Plastic Surgery    Other Visit Diagnoses    Dermatitis       Relevant Medications   mupirocin nasal ointment (BACTROBAN) 2 %   hydrocortisone cream 1 %   Rhinorrhea       Relevant Medications   fluticasone (FLONASE) 50 MCG/ACT nasal spray   loratadine (CLARITIN) 10 MG tablet   Healthcare maintenance       Relevant Orders   Tdap vaccine  greater than or equal to 7yo IM (Completed)      Meds ordered this encounter  Medications  . DISCONTD: Tdap (BOOSTRIX) injection 0.5 mL  . fluticasone (FLONASE) 50 MCG/ACT nasal spray    Sig: Place 2 sprays into both nostrils daily.    Dispense:  16 g    Refill:  2    Order Specific Question:   Supervising Provider    Answer:   Tresa Garter [id number]  . mupirocin nasal ointment (BACTROBAN) 2 %    Sig: Apply in each nostril daily x 2 weeks    Dispense:  1 g    Refill:  0    Order Specific Question:   Supervising Provider    Answer:   Tresa Garter  [id number]  . hydrocortisone cream 1 %    Sig: Apply 1 application topically 2 (two) times daily as needed for itching. Apply to external nose only.    Dispense:  30 g    Refill:  0    Order Specific Question:   Supervising Provider    Answer:   Tresa Garter [id number]  . loratadine (CLARITIN) 10 MG tablet    Sig: Take 1 tablet (10 mg total) by mouth daily.    Dispense:  30 tablet    Refill:  2    Order Specific Question:   Supervising Provider    Answer:   Tresa Garter [id number]    Follow-up: Return if symptoms worsen or fail to improve.   Alfonse Spruce FNP

## 2017-03-12 NOTE — Patient Instructions (Signed)
Rash A rash is a change in the color of the skin. A rash can also change the way your skin feels. There are many different conditions and factors that can cause a rash. Follow these instructions at home: Pay attention to any changes in your symptoms. Follow these instructions to help with your condition: Medicine  Take or apply over-the-counter and prescription medicines only as told by your doctor. These may include:  Corticosteroid cream.  Anti-itch lotions.  Oral antihistamines. Skin Care   Put cool compresses on the affected areas.  Try taking a bath with:  Epsom salts. Follow the instructions on the packaging. You can get these at your local pharmacy or grocery store.  Baking soda. Pour a small amount into the bath as told by your doctor.  Colloidal oatmeal. Follow the instructions on the packaging. You can get this at your local pharmacy or grocery store.  Try putting baking soda paste onto your skin. Stir water into baking soda until it gets like a paste.  Do not scratch or rub your skin.  Avoid covering the rash. Make sure the rash is exposed to air as much as possible. General instructions   Avoid hot showers or baths, which can make itching worse. A cold shower may help.  Avoid scented soaps, detergents, and perfumes. Use gentle soaps, detergents, perfumes, and other cosmetic products.  Avoid anything that causes your rash. Keep a journal to help track what causes your rash. Write down:  What you eat.  What cosmetic products you use.  What you drink.  What you wear. This includes jewelry.  Keep all follow-up visits as told by your doctor. This is important. Contact a doctor if:  You sweat at night.  You lose weight.  You pee (urinate) more than normal.  You feel weak.  You throw up (vomit).  Your skin or the whites of your eyes look yellow (jaundice).  Your skin:  Tingles.  Is numb.  Your rash:  Does not go away after a few days.  Gets  worse.  You are:  More thirsty than normal.  More tired than normal.  You have:  New symptoms.  Pain in your belly (abdomen).  A fever.  Watery poop (diarrhea). Get help right away if:  Your rash covers all or most of your body. The rash may or may not be painful.  You have blisters that:  Are on top of the rash.  Grow larger.  Grow together.  Are painful.  Are inside your nose or mouth.  You have a rash that:  Looks like purple pinprick-sized spots all over your body.  Has a "bull's eye" or looks like a target.  Is red and painful, causes your skin to peel, and is not from being in the sun too long. This information is not intended to replace advice given to you by your health care provider. Make sure you discuss any questions you have with your health care provider. Document Released: 05/01/2008 Document Revised: 04/20/2016 Document Reviewed: 03/31/2015 Elsevier Interactive Patient Education  2017 Elsevier Inc.  

## 2017-03-12 NOTE — Progress Notes (Signed)
Patient is here for establish care  Patient has treacher collins syndrome need referral before surgery  Patient denies pain for today  Patient needs refill on nasal spray & mupirocin nasal ointment

## 2017-03-22 ENCOUNTER — Ambulatory Visit: Payer: Medicaid Other | Attending: Family Medicine | Admitting: Family Medicine

## 2017-03-22 ENCOUNTER — Encounter: Payer: Self-pay | Admitting: Family Medicine

## 2017-03-22 VITALS — BP 109/73 | HR 82 | Temp 97.9°F | Resp 18 | Ht 66.0 in | Wt 125.2 lb

## 2017-03-22 DIAGNOSIS — R519 Headache, unspecified: Secondary | ICD-10-CM

## 2017-03-22 DIAGNOSIS — R51 Headache: Secondary | ICD-10-CM | POA: Diagnosis not present

## 2017-03-22 DIAGNOSIS — R109 Unspecified abdominal pain: Secondary | ICD-10-CM | POA: Diagnosis not present

## 2017-03-22 DIAGNOSIS — R140 Abdominal distension (gaseous): Secondary | ICD-10-CM | POA: Diagnosis not present

## 2017-03-22 DIAGNOSIS — R143 Flatulence: Secondary | ICD-10-CM | POA: Diagnosis not present

## 2017-03-22 DIAGNOSIS — R0981 Nasal congestion: Secondary | ICD-10-CM

## 2017-03-22 MED ORDER — ACETAMINOPHEN 500 MG PO TABS: 1000.0000 mg | ORAL_TABLET | Freq: Three times a day (TID) | ORAL | 0 refills | Status: DC | PRN

## 2017-03-22 MED ORDER — FEXOFENADINE-PSEUDOEPHED ER 180-240 MG PO TB24: 1.0000 | ORAL_TABLET | Freq: Every day | ORAL | 1 refills | Status: DC

## 2017-03-22 MED ORDER — SIMETHICONE 80 MG PO CHEW: 80.0000 mg | CHEWABLE_TABLET | Freq: Four times a day (QID) | ORAL | 0 refills | Status: DC | PRN

## 2017-03-22 NOTE — Progress Notes (Signed)
Subjective:  Patient ID: Caitlin Banks, female    DOB: [birth year]  Age: 26 y.o. MRN: [id number]  CC: Establish Care   HPI Caitlin Banks presents for   Headache pain:: Patient presents with headache. Symptoms began about 1 day ago. Generally, the headache lasted about 30 minutes and occur rarely. The headache do not seem to be related to any time of the day. The headaches are usually mild and dull and are frontal in location.   Precipitating factors include none which have been determined. The headaches are usually not preceded by an aura. No associated neurologic symptoms which are present.The patient denies decreased physical activity, dizziness, loss of balance, speech difficulties and vision problems. Other associated symptoms include: nasal congestion.  Symptoms which are not present include: nausea, neck stiffness and photophobia. Home treatment has included nothing. Other history includes: allergic rhinitis. Family history includes no known family members with significant headaches.  Abdominal Pain: Patient complains of abdominal pain. The pain is described as aching and bloating, and is mild in intensity. Pain is located in the generalized abdomen. Onset was several days ago. Symptoms have been stable since. Aggravating factors: none.  Alleviating factors: flatus. Associated symptoms: bloating, flatulence, and bleching. The patient denies anorexia, constipation, diarrhea, dysuria, hematochezia, melena, nausea and vomiting. She is not sexually active.  Outpatient Medications Prior to Visit  Medication Sig Dispense Refill  . cephALEXin (KEFLEX) 500 MG capsule Take 1 capsule (500 mg total) by mouth 4 (four) times daily. 28 capsule 0  . fluticasone (FLONASE) 50 MCG/ACT nasal spray Place 2 sprays into both nostrils daily. 16 g 2  . hydrocortisone cream 1 % Apply 1 application topically 2 (two) times daily as needed for itching. Apply to external nose only. 30 g 0  . ibuprofen (ADVIL,MOTRIN)  800 MG tablet Take 1 tablet (800 mg total) by mouth 3 (three) times daily. Prn pain. Additional refills from you PCP only. 20 tablet 0  . loratadine (CLARITIN) 10 MG tablet Take 1 tablet (10 mg total) by mouth daily. 30 tablet 2  . mupirocin nasal ointment (BACTROBAN) 2 % Apply in each nostril daily x 2 weeks 1 g 0   No facility-administered medications prior to visit.     ROS Review of Systems  Constitutional: Negative.   HENT: Positive for congestion.   Eyes: Negative.   Respiratory: Negative.   Cardiovascular: Negative.   Gastrointestinal:       Abdominal bloating and gas.  Neurological: Positive for headaches.    Objective:  BP 109/73 (BP Location: Left Arm, Patient Position: Sitting, Cuff Size: Normal)   Pulse 82   Temp 97.9 F (36.6 C) (Oral)   Resp 18   Ht 5\' 6"  (1.676 m)   Wt 125 lb 3.2 oz (56.8 kg)   SpO2 98%   BMI 20.21 kg/m   BP/Weight 03/22/2017 03/12/2017 80/01/4916  Systolic BP 915 056 979  Diastolic BP 73 68 58  Wt. (Lbs) 125.2 123.2 -  BMI 20.21 19.89 -     Physical Exam  HENT:  Head: Normocephalic.  Mouth/Throat: Oropharynx is clear and moist. No oropharyngeal exudate.  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 Cochlear implant device to posterior right ear. Nose: Area of brown skin discoloration underneath nares bilaterally.  Eyes: Conjunctivae are normal. Pupils are equal, round, and reactive to light.  Neck: Normal range of motion. Neck supple.  Cardiovascular: Normal rate, regular rhythm, normal heart sounds and intact distal pulses.   Pulmonary/Chest: Effort normal and  breath sounds normal.  Abdominal: Soft. Bowel sounds are normal. She exhibits no mass. There is no tenderness.  Lymphadenopathy:    She has no cervical adenopathy.  Skin: Skin is warm and dry.  Nursing note and vitals reviewed.   Assessment & Plan:   Problem List Items Addressed This Visit    None    Visit Diagnoses    Flatulence/gas pain/belching    -  Primary    Relevant Medications   simethicone (GAS-X) 80 MG chewable tablet   Nasal sinus congestion       Relevant Medications   fexofenadine-pseudoephedrine (ALLEGRA-D ALLERGY & CONGESTION) 180-240 MG 24 hr tablet   acetaminophen (TYLENOL) 500 MG tablet   Acute nonintractable headache, unspecified headache type       Symptoms suggest headache symptoms are related to nasal congestion.    Relevant Medications   acetaminophen (TYLENOL) 500 MG tablet      Meds ordered this encounter  Medications  . fexofenadine-pseudoephedrine (ALLEGRA-D ALLERGY & CONGESTION) 180-240 MG 24 hr tablet    Sig: Take 1 tablet by mouth daily.    Dispense:  24 tablet    Refill:  1    Order Specific Question:   Supervising Provider    Answer:   Tresa Garter [id number]  . simethicone (GAS-X) 80 MG chewable tablet    Sig: Chew 1 tablet (80 mg total) by mouth every 6 (six) hours as needed for flatulence.    Dispense:  30 tablet    Refill:  0    Order Specific Question:   Supervising Provider    Answer:   Tresa Garter [id number]  . acetaminophen (TYLENOL) 500 MG tablet    Sig: Take 2 tablets (1,000 mg total) by mouth every 8 (eight) hours as needed for headache.    Dispense:  30 tablet    Refill:  0    Order Specific Question:   Supervising Provider    Answer:   Tresa Garter [id number]    Follow-up: Return if symptoms worsen or fail to improve.   Alfonse Spruce FNP

## 2017-03-22 NOTE — Progress Notes (Signed)
Patient is here for headaches that comes and goes  Patient has stomach pain & an upset stomach

## 2017-03-22 NOTE — Patient Instructions (Addendum)
Do not take Claritin while taking Allegra-D. Use Flonase as prescribed for nasal congestion   Sinus Headache A sinus headache happens when your sinuses become clogged or swollen. You may feel pain or pressure in your face, forehead, ears, or upper teeth. Sinus headaches can be mild or severe. Follow these instructions at home:  Take medicines only as told by your doctor.  If you were given an antibiotic medicine, finish all of it even if you start to feel better.  Use a nose spray if you feel stuffed up (congested).  If told, apply a warm, moist washcloth to your face to help lessen pain. Contact a doctor if:  You get headaches more than one time each week.  Light or sound bothers you.  You have a fever.  You feel sick to your stomach (nauseous) or you throw up (vomit).  Your headaches do not get better with treatment. Get help right away if:  You have trouble seeing.  You suddenly have very bad pain in your face or head.  You start to twitch or shake (seizure).  You are confused.  You have a stiff neck. This information is not intended to replace advice given to you by your health care provider. Make sure you discuss any questions you have with your health care provider. Document Released: 03/15/2011 Document Revised: 07/09/2016 Document Reviewed: 11/09/2014 Elsevier Interactive Patient Education  2017 [employer].  Fexofenadine; Pseudoephedrine tablets What is this medicine? FEXOFENADINE; PSEUDOEPHEDRINE (fex oh FEN a deen; soo doe e FED rin) is an antihistamine and a decongestant. This medicine is used to treat or prevent symptoms of allergies. This medicine may be used for other purposes; ask your health care provider or pharmacist if you have questions. COMMON BRAND NAME(S): Allegra-D, Allegra-D 12 Hour Allergy & Congestion, Allegra-D 24 Hour Allergy & Congestion What should I tell my health care provider before I take this medicine? They need to know if you have  any of these conditions: -diabetes -difficulty urinating -disease of the blood vessels or heart -glaucoma -high blood pressure -kidney disease -prostate trouble -untreated thyroid problems -an unusual or allergic reaction to fexofenadine, pseudoephedrine, other medicines, foods, dyes, or preservatives -pregnant or trying to get pregnant -breast-feeding How should I use this medicine? Swallow this medicine with a glass of water. Do not break, crush, or chew. Follow the directions on the prescription label. Take the tablet on an empty stomach, one hour before or two hours after a meal. Take your doses at regular intervals. Do not take your medicine more often than directed. Talk to your pediatrician regarding the use of this medicine in children. Special care may be needed. Patients over 37 years old may have a stronger reaction and need a smaller dose. Overdosage: If you think you have taken too much of this medicine contact a poison control center or emergency room at once. NOTE: This medicine is only for you. Do not share this medicine with others. What if I miss a dose? If you miss a dose, take it as soon as you can. If it is almost time for your next dose, take only that dose. Do not take double or extra doses. What may interact with this medicine? Do not take this medicine with any of the following medications: -bromocriptine -ergot alkaloids like dihydroergotamine, ergonovine, ergotamine, methylergonovine -MAOIs like Carbex, Eldepryl, Marplan, Nardil, and Parnate -procarbazine -stimulant medicines for attention disorders, weight loss, or to stay awake This medicine may also interact with the following medications: -antacids -  erythromycin -grapefruit, apple, or orange juice -ketoconazole -linezolid -magnesium products -medicines for blood pressure -medicines for heart problems -methyldopa -some medicines for colds, allergies and breathing difficulties This list may not  describe all possible interactions. Give your health care provider a list of all the medicines, herbs, non-prescription drugs, or dietary supplements you use. Also tell them if you smoke, drink alcohol, or use illegal drugs. Some items may interact with your medicine. What should I watch for while using this medicine? Visit your doctor or health care professional for regular check ups. Tell your doctor or health care professional if your symptoms do not improve. If you have high blood pressure, check your blood pressure regularly. Ask your health care professional what your blood pressure should be, and when you should contact them. You may get drowsy or dizzy. Do not drive, use machinery, or do anything that needs mental alertness until you know how this medicine affects you. Do not stand or sit up quickly, especially if you are an older patient. This reduces the risk of dizzy or fainting spells. Alcohol may interfere with the effect of this medicine. Avoid alcoholic drinks. The tablet shell for some brands of this medicine does not dissolve. This is normal. The tablet shell may appear whole in the stool. This is not a cause for concern. What side effects may I notice from receiving this medicine? Side effects that you should report to your doctor or health care professional as soon as possible: -allergic reactions like skin rash or hives, swelling of the face, lips, or tongue -difficulty urinating -dizzy or confused -fast heartbeat or chest pain -infection or fever -nausea or vomiting -nervousness or difficulty sleeping -rise in blood pressure -tremor or muscle contractions -trouble breathing Side effects that usually do not require medical attention (report to your doctor or health care professional if they continue or are bothersome): -back pain -dry mouth -headache -loss of appetite This list may not describe all possible side effects. Call your doctor for medical advice about side  effects. You may report side effects to FDA at 1-800-FDA-1088. Where should I keep my medicine? Keep out of the reach of children. Store at room temperature between 20 to 25 degrees C (68 and 77 degrees F). Protect from moisture. Throw away any unused medicine after the expiration date. NOTE: This sheet is a summary. It may not cover all possible information. If you have questions about this medicine, talk to your doctor, pharmacist, or health care provider.  2018 Elsevier/Gold Standard (2010-03-07 11:24:40)   Simethicone oral tablets or capsules What is this medicine? SIMETHICONE (sye METH i kone) is used to decrease the discomfort caused by gas. This medicine may be used for other purposes; ask your health care provider or pharmacist if you have questions. COMMON BRAND NAME(S): Gas Free, Gas Relief, Gas-X, Gas-X Extra Strength, Gas-X Ultra Strength, GasAid, Mylanta Gas, Phazyme What should I tell my health care provider before I take this medicine? They need to know if you have any of these conditions: -an unusual or allergic reaction to simethicone, other medicines, foods, dyes, or preservatives -pregnant or trying to get pregnant -breast-feeding How should I use this medicine? Take this medicine by mouth with a glass of water. Follow the directions on the label or those given to you by your doctor or health care professional. Do not take your medicine more often than directed. Talk to your pediatrician regarding the use of this medicine in children. Special care may be needed. While this  medicine may be used in children as young as 12 years for selected conditions, precautions do apply. Overdosage: If you think you have taken too much of this medicine contact a poison control center or emergency room at once. NOTE: This medicine is only for you. Do not share this medicine with others. What if I miss a dose? This does not apply. You will only use this medicine as needed for gas pain. Do  not use double or extra doses. What may interact with this medicine? Interactions are not expected. This list may not describe all possible interactions. Give your health care provider a list of all the medicines, herbs, non-prescription drugs, or dietary supplements you use. Also tell them if you smoke, drink alcohol, or use illegal drugs. Some items may interact with your medicine. What should I watch for while using this medicine? Tell your doctor or health care professional if your symptoms get worse, or if you have severe pain, diarrhea, constipation, or blood in your stool. These could be signs of a more serious condition. What side effects may I notice from receiving this medicine? There are no reported side effects of this medicine. This list may not describe all possible side effects. Call your doctor for medical advice about side effects. You may report side effects to FDA at 1-800-FDA-1088. Where should I keep my medicine? Keep out of the reach of children. Store at room temperature between 15 and 30 degrees C (59 and 86 degrees F). Keep container tightly closed. Throw away any unused medicine after the expiration date. NOTE: This sheet is a summary. It may not cover all possible information. If you have questions about this medicine, talk to your doctor, pharmacist, or health care provider.  2018 Elsevier/Gold Standard (2008-07-17 13:07:08)

## 2017-05-28 ENCOUNTER — Ambulatory Visit: Payer: Medicaid Other | Attending: Family Medicine | Admitting: Family Medicine

## 2017-05-28 ENCOUNTER — Other Ambulatory Visit (HOSPITAL_COMMUNITY)
Admission: RE | Admit: 2017-05-28 | Discharge: 2017-05-28 | Disposition: A | Discharge status: Home / Self Care | Payer: Medicaid Other | Source: Ambulatory Visit | Attending: Family Medicine | Admitting: Family Medicine

## 2017-05-28 VITALS — BP 92/64 | HR 72 | Temp 98.6°F | Resp 18 | Ht 63.0 in | Wt 123.2 lb

## 2017-05-28 DIAGNOSIS — R350 Frequency of micturition: Secondary | ICD-10-CM | POA: Insufficient documentation

## 2017-05-28 DIAGNOSIS — Z79899 Other long term (current) drug therapy: Secondary | ICD-10-CM | POA: Insufficient documentation

## 2017-05-28 DIAGNOSIS — N898 Other specified noninflammatory disorders of vagina: Secondary | ICD-10-CM | POA: Diagnosis not present

## 2017-05-28 DIAGNOSIS — R102 Pelvic and perineal pain: Secondary | ICD-10-CM | POA: Insufficient documentation

## 2017-05-28 LAB — POCT URINALYSIS DIPSTICK
BILIRUBIN UA: NEGATIVE
Blood, UA: NEGATIVE
GLUCOSE UA: NEGATIVE
Ketones, UA: NEGATIVE
Leukocytes, UA: NEGATIVE
Nitrite, UA: NEGATIVE
Protein, UA: NEGATIVE
SPEC GRAV UA: 1.02 (ref 1.010–1.025)
UROBILINOGEN UA: 0.2 U/dL
pH, UA: 5.5 (ref 5.0–8.0)

## 2017-05-28 LAB — POCT URINE PREGNANCY: Preg Test, Ur: NEGATIVE

## 2017-05-28 MED ORDER — FLUCONAZOLE 150 MG PO TABS: 150.0000 mg | ORAL_TABLET | Freq: Once | ORAL | 1 refills | Status: AC

## 2017-05-28 NOTE — Patient Instructions (Addendum)
You will be called with your labs results.   Vaginitis Vaginitis is an inflammation of the vagina. It can happen when the normal bacteria and yeast in the vagina grow too much. There are different types. Treatment will depend on the type you have. Follow these instructions at home:  Take all medicines as told by your doctor.  Keep your vagina area clean and dry. Avoid soap. Rinse the area with water.  Avoid washing and cleaning out the vagina (douching).  Do not use tampons or have sex (intercourse) until your treatment is done.  Wipe from front to back after going to the restroom.  Wear cotton underwear.  Avoid wearing underwear while you sleep until your vaginitis is gone.  Avoid tight pants. Avoid underwear or nylons without a cotton panel.  Take off wet clothing (such as a bathing suit) as soon as you can.  Use mild, unscented products. Avoid fabric softeners and scented: ? Feminine sprays. ? Laundry detergents. ? Tampons. ? Soaps or bubble baths.  Practice safe sex and use condoms. Get help right away if:  You have belly (abdominal) pain.  You have a fever or lasting symptoms for more than 2-3 days.  You have a fever and your symptoms suddenly get worse. This information is not intended to replace advice given to you by your health care provider. Make sure you discuss any questions you have with your health care provider. Document Released: 02/09/2009 Document Revised: 04/20/2016 Document Reviewed: 04/25/2012 Elsevier Interactive Patient Education  2017 [employer].

## 2017-05-28 NOTE — Progress Notes (Signed)
Patient is here for possible yeats infection  Patient stated that  he has brown discharge

## 2017-05-28 NOTE — Progress Notes (Signed)
Subjective:  Patient ID: Caitlin Banks, female    DOB: [birth year]  Age: 26 y.o. MRN: [id number]  CC: Vaginitis   HPI Caitlin Banks presents complains of an abnormal vaginal discharge for 1 month. Vaginal symptoms include discharge described as white.Vulvar symptoms include local irritation and burning, and pruritus. She declines STI screening. She denies pelvic examination/PAP. She denies any sexual partners within the last 3 months.   Outpatient Medications Prior to Visit  Medication Sig Dispense Refill  . acetaminophen (TYLENOL) 500 MG tablet Take 2 tablets (1,000 mg total) by mouth every 8 (eight) hours as needed for headache. 30 tablet 0  . cephALEXin (KEFLEX) 500 MG capsule Take 1 capsule (500 mg total) by mouth 4 (four) times daily. 28 capsule 0  . fexofenadine-pseudoephedrine (ALLEGRA-D ALLERGY & CONGESTION) 180-240 MG 24 hr tablet Take 1 tablet by mouth daily. 24 tablet 1  . fluticasone (FLONASE) 50 MCG/ACT nasal spray Place 2 sprays into both nostrils daily. 16 g 2  . hydrocortisone cream 1 % Apply 1 application topically 2 (two) times daily as needed for itching. Apply to external nose only. 30 g 0  . ibuprofen (ADVIL,MOTRIN) 800 MG tablet Take 1 tablet (800 mg total) by mouth 3 (three) times daily. Prn pain. Additional refills from you PCP only. 20 tablet 0  . loratadine (CLARITIN) 10 MG tablet Take 1 tablet (10 mg total) by mouth daily. 30 tablet 2  . mupirocin nasal ointment (BACTROBAN) 2 % Apply in each nostril daily x 2 weeks 1 g 0  . simethicone (GAS-X) 80 MG chewable tablet Chew 1 tablet (80 mg total) by mouth every 6 (six) hours as needed for flatulence. 30 tablet 0   No facility-administered medications prior to visit.     ROS Review of Systems  Constitutional: Negative.   Respiratory: Negative.   Cardiovascular: Negative.   Gastrointestinal: Negative.   Genitourinary: Positive for dysuria and vaginal discharge.  Skin: Negative.   Psychiatric/Behavioral:  Negative.     Objective:  BP 92/64 (BP Location: Left Arm, Patient Position: Sitting, Cuff Size: Normal)   Pulse 72   Temp 98.6 F (37 C) (Oral)   Resp 18   Ht 5\' 3"  (1.6 m)   Wt 123 lb 3.2 oz (55.9 kg)   SpO2 100%   BMI 21.82 kg/m   BP/Weight 05/28/2017 03/22/2017 2/87/8676  Systolic BP 92 720 947  Diastolic BP 64 73 68  Wt. (Lbs) 123.2 125.2 123.2  BMI 21.82 20.21 19.89     Physical Exam  Constitutional: She appears well-developed and well-nourished.  Eyes: Conjunctivae are normal. Pupils are equal, round, and reactive to light.  Cardiovascular: Normal rate, regular rhythm, normal heart sounds and intact distal pulses.   Pulmonary/Chest: Effort normal and breath sounds normal.  Abdominal: Soft. Bowel sounds are normal. There is no tenderness.  Genitourinary:  Genitourinary Comments: Urine cytology  Skin: Skin is warm and dry.  Psychiatric: She has a normal mood and affect.  Nursing note and vitals reviewed.   Assessment & Plan:   Problem List Items Addressed This Visit    None    Visit Diagnoses    Vaginal discharge    -  Primary   Relevant Orders   POCT urine pregnancy (Completed)   Urine cytology ancillary only   Frequency of urination       Relevant Orders   POCT urinalysis dipstick (Completed)   Pelvic pain       Relevant Orders   POCT urine  pregnancy (Completed)   POCT urinalysis dipstick (Completed)   Urine cytology ancillary only     Meds ordered this encounter  Medications  . fluconazole (DIFLUCAN) 150 MG tablet    Sig: Take 1 tablet (150 mg total) by mouth once.    Dispense:  1 tablet    Refill:  1    Order Specific Question:   Supervising Provider    Answer:   Tresa Garter [id number]    Follow-up: Return if symptoms worsen or fail to improve.   Alfonse Spruce FNP

## 2017-06-01 LAB — URINE CYTOLOGY ANCILLARY ONLY
BACTERIAL VAGINITIS: NEGATIVE
CANDIDA VAGINITIS: NEGATIVE

## 2017-06-06 ENCOUNTER — Telehealth: Payer: Self-pay

## 2017-06-06 NOTE — Telephone Encounter (Signed)
CMA call regarding lab results   Patient did not answer but left a VM stating the reason of the call & to call back  

## 2017-06-06 NOTE — Telephone Encounter (Signed)
-----   Message from Alfonse Spruce, [place] sent at 06/06/2017  1:36 PM EDT ----- BV and Yeast were negative.

## 2017-06-12 ENCOUNTER — Telehealth: Payer: Self-pay | Admitting: Family Medicine

## 2017-06-12 NOTE — Telephone Encounter (Signed)
Pt's Father  call requesting a referral to   [facility]   Thank You  [address], [place], [place] [address] Appts: [phone]

## 2017-06-12 NOTE — Telephone Encounter (Signed)
Pt's Father  call requesting a referral to   [facility]   Thank You  [address], [place], [place] [address] Appts: [phone]

## 2017-06-14 ENCOUNTER — Other Ambulatory Visit: Payer: Self-pay | Admitting: Family Medicine

## 2017-06-15 ENCOUNTER — Other Ambulatory Visit: Payer: Self-pay | Admitting: Family Medicine

## 2017-06-15 DIAGNOSIS — Q754 Mandibulofacial dysostosis: Secondary | ICD-10-CM

## 2017-06-15 NOTE — Telephone Encounter (Signed)
Referral order placed. Referral specialist to follow up.

## 2017-06-15 NOTE — Telephone Encounter (Signed)
CMA call patient father regarding referral  Patient father Sharma Covert not answer but left a VM stating the reason of the call & to call back if have any questions

## 2017-06-18 NOTE — Telephone Encounter (Signed)
Noted  Per Patient request sent referral to  [facility]    [address] [place], [place] [address] Appts: [phone]

## 2017-11-30 ENCOUNTER — Other Ambulatory Visit (HOSPITAL_COMMUNITY)
Admission: RE | Admit: 2017-11-30 | Discharge: 2017-11-30 | Disposition: A | Discharge status: Home / Self Care | Payer: Medicare Other | Source: Ambulatory Visit | Attending: Family Medicine | Admitting: Family Medicine

## 2017-11-30 ENCOUNTER — Ambulatory Visit: Payer: Medicare Other | Attending: Family Medicine | Admitting: Family Medicine

## 2017-11-30 ENCOUNTER — Encounter: Payer: Self-pay | Admitting: Family Medicine

## 2017-11-30 VITALS — BP 104/65 | HR 84 | Temp 97.5°F | Resp 18 | Ht 66.0 in | Wt 123.4 lb

## 2017-11-30 DIAGNOSIS — Z131 Encounter for screening for diabetes mellitus: Secondary | ICD-10-CM | POA: Diagnosis not present

## 2017-11-30 DIAGNOSIS — R300 Dysuria: Secondary | ICD-10-CM | POA: Diagnosis not present

## 2017-11-30 DIAGNOSIS — N898 Other specified noninflammatory disorders of vagina: Secondary | ICD-10-CM | POA: Diagnosis not present

## 2017-11-30 DIAGNOSIS — Q754 Mandibulofacial dysostosis: Secondary | ICD-10-CM | POA: Diagnosis not present

## 2017-11-30 DIAGNOSIS — L700 Acne vulgaris: Secondary | ICD-10-CM | POA: Diagnosis not present

## 2017-11-30 LAB — POCT URINALYSIS DIPSTICK
Bilirubin, UA: NEGATIVE
GLUCOSE UA: NEGATIVE
Ketones, UA: NEGATIVE
LEUKOCYTES UA: NEGATIVE
NITRITE UA: NEGATIVE
PROTEIN UA: 30
Spec Grav, UA: 1.015 (ref 1.010–1.025)
Urobilinogen, UA: 0.2 E.U./dL
pH, UA: 8.5 — AB (ref 5.0–8.0)

## 2017-11-30 MED ORDER — ADAPALENE 0.1 % EX GEL: Freq: Every day | CUTANEOUS | 0 refills | Status: DC

## 2017-11-30 MED ORDER — CETAPHIL GENTLE CLEANSING EX BAR: CHEWABLE_BAR | CUTANEOUS | Status: DC

## 2017-11-30 MED ORDER — FLUCONAZOLE 150 MG PO TABS: 150.0000 mg | ORAL_TABLET | Freq: Once | ORAL | 0 refills | Status: AC

## 2017-11-30 NOTE — Progress Notes (Signed)
Subjective:  Patient ID: Caitlin Banks, female    DOB: [birth year]  Age: 27 y.o. MRN: [id number]  CC: Follow-up   HPI Caitlin Banks presents for follow up. History of Treacher's-Collins syndrome w/ bilateral atresia. History of multiple face reconstructive surgeries. She reports upcoming plastic surgery January 23.  She presents with complaints of vaginal discharge. Vaginal symptoms include discharge described as white and thick .  She denies any vaginal lesions or dysuria.  She declines STI screening. Acne: She complains of scattered facial comedones. She denies any new medications or skin products.  She reports using over-the-counter soap for symptoms with no relief.    Outpatient Medications Prior to Visit  Medication Sig Dispense Refill  . acetaminophen (TYLENOL) 500 MG tablet Take 2 tablets (1,000 mg total) by mouth every 8 (eight) hours as needed for headache. 30 tablet 0  . fexofenadine-pseudoephedrine (ALLEGRA-D ALLERGY & CONGESTION) 180-240 MG 24 hr tablet Take 1 tablet by mouth daily. 24 tablet 1  . fluticasone (FLONASE) 50 MCG/ACT nasal spray Place 2 sprays into both nostrils daily. 16 g 2  . hydrocortisone cream 1 % Apply 1 application topically 2 (two) times daily as needed for itching. Apply to external nose only. 30 g 0  . ibuprofen (ADVIL,MOTRIN) 800 MG tablet Take 1 tablet (800 mg total) by mouth 3 (three) times daily. Prn pain. Additional refills from you PCP only. 20 tablet 0  . loratadine (CLARITIN) 10 MG tablet Take 1 tablet (10 mg total) by mouth daily. 30 tablet 2  . mupirocin nasal ointment (BACTROBAN) 2 % Apply in each nostril daily x 2 weeks 1 g 0  . simethicone (GAS-X) 80 MG chewable tablet Chew 1 tablet (80 mg total) by mouth every 6 (six) hours as needed for flatulence. 30 tablet 0  . cephALEXin (KEFLEX) 500 MG capsule Take 1 capsule (500 mg total) by mouth 4 (four) times daily. 28 capsule 0   No facility-administered medications prior to visit.      ROS Review of Systems  Constitutional: Negative.   Respiratory: Negative.   Cardiovascular: Negative.   Gastrointestinal: Negative.   Genitourinary: Positive for dysuria and vaginal discharge.  Skin: Positive for rash (Comedones).    Objective:  BP 104/65 (BP Location: Left Arm, Patient Position: Sitting, Cuff Size: Normal)   Pulse 84   Temp (!) 97.5 F (36.4 C) (Oral)   Resp 18   Ht 5\' 6"  (1.676 m)   Wt 123 lb 6.4 oz (56 kg)   SpO2 96%   BMI 19.92 kg/m   BP/Weight 11/30/2017 05/28/2017 2/59/5638  Systolic BP 756 92 433  Diastolic BP 65 64 73  Wt. (Lbs) 123.4 123.2 125.2  BMI 19.92 21.82 20.21     Physical Exam  Constitutional: She appears well-developed and well-nourished.  HENT:  Head: Normocephalic and atraumatic.  Bilateral ear deformity.  Cochlear implant present.  Cardiovascular: Normal rate, regular rhythm, normal heart sounds and intact distal pulses.  Pulmonary/Chest: Effort normal and breath sounds normal.  Abdominal: Soft. Bowel sounds are normal.  Genitourinary:  Genitourinary Comments: Urine cytology.  Skin: Rash noted. Rash is papular (comodones to face).  Nursing note and vitals reviewed.   Assessment & Plan:   1. Dysuria  - Urinalysis Dipstick  2. Vaginal discharge  - Urine cytology ancillary only - fluconazole (DIFLUCAN) 150 MG tablet; Take 1 tablet (150 mg total) by mouth once for 1 dose.  Dispense: 1 tablet; Refill: 0  3. Acne vulgaris  - adapalene (DIFFERIN)  0.1 % gel; Apply topically at bedtime.  Dispense: 45 g; Refill: 0 - Soap & Cleansers (CETAPHIL GENTLE CLEANSING) BAR; Wash face twice daily morning and night. Rinse with warm water and pat dry.  4. Screening for diabetes mellitus (DM)  - Hemoglobin A1c      Follow-up: Return if symptoms worsen or fail to improve.   Alfonse Spruce FNP

## 2017-11-30 NOTE — Patient Instructions (Signed)

## 2017-12-01 LAB — HEMOGLOBIN A1C
ESTIMATED AVERAGE GLUCOSE: 97 mg/dL
HEMOGLOBIN A1C: 5 % (ref 4.8–5.6)

## 2017-12-03 ENCOUNTER — Ambulatory Visit: Payer: Self-pay | Admitting: Family Medicine

## 2017-12-04 ENCOUNTER — Telehealth (INDEPENDENT_AMBULATORY_CARE_PROVIDER_SITE_OTHER): Payer: Self-pay | Admitting: *Deleted

## 2017-12-04 ENCOUNTER — Ambulatory Visit: Payer: Self-pay | Admitting: Family Medicine

## 2017-12-04 NOTE — Telephone Encounter (Signed)
-----   Message from Alfonse Spruce, [place] sent at 12/03/2017  1:52 PM EST ----- Negative diabetes screening.

## 2017-12-04 NOTE — Telephone Encounter (Signed)
Medical Assistant left message on patient's home and cell voicemail. Voicemail states to give a call back to [place] with [facility] at [phone]. Patient is aware of not being diabetic.

## 2017-12-06 LAB — URINE CYTOLOGY ANCILLARY ONLY
Bacterial vaginitis: NEGATIVE
CANDIDA VAGINITIS: NEGATIVE

## 2017-12-10 ENCOUNTER — Ambulatory Visit: Payer: Self-pay | Admitting: Family Medicine

## 2017-12-10 ENCOUNTER — Telehealth: Payer: Self-pay | Admitting: *Deleted

## 2017-12-10 NOTE — Telephone Encounter (Signed)
Pt may be informed:  Notes recorded by Alfonse Spruce, FNP on 12/10/2017 at 11:02 AM EST BV and yeast are both negative. Follow up if symptoms persist.   Left message on voicemail to return call

## 2017-12-17 ENCOUNTER — Telehealth: Payer: Self-pay | Admitting: Family Medicine

## 2017-12-17 NOTE — Telephone Encounter (Signed)
Medical Assistant left message on patient's home and cell voicemail. Voicemail states to give a call back to [place] with [facility] at [phone]. Patient is aware of DM being negative and vaginal testing was negative. Patient advised to follow up if symptoms persist.

## 2017-12-17 NOTE — Telephone Encounter (Signed)
Pt. Returned nurse call and was informed of vaginal and DM results being negative. Pt. Have no questions.

## 2017-12-17 NOTE — Telephone Encounter (Signed)
-----   Message from Alfonse Spruce, [place] sent at 12/10/2017 11:02 AM EST ----- BV and yeast are both negative. Follow up if symptoms persist.

## 2017-12-24 ENCOUNTER — Encounter: Payer: Self-pay | Admitting: Family Medicine

## 2017-12-24 ENCOUNTER — Other Ambulatory Visit: Payer: Self-pay

## 2017-12-24 ENCOUNTER — Ambulatory Visit: Payer: Medicare Other | Attending: Family Medicine | Admitting: Family Medicine

## 2017-12-24 VITALS — BP 115/83 | HR 82 | Temp 98.4°F | Resp 12 | Wt 122.8 lb

## 2017-12-24 DIAGNOSIS — R3915 Urgency of urination: Secondary | ICD-10-CM | POA: Insufficient documentation

## 2017-12-24 DIAGNOSIS — Q754 Mandibulofacial dysostosis: Secondary | ICD-10-CM | POA: Diagnosis not present

## 2017-12-24 DIAGNOSIS — R350 Frequency of micturition: Secondary | ICD-10-CM | POA: Diagnosis not present

## 2017-12-24 DIAGNOSIS — R399 Unspecified symptoms and signs involving the genitourinary system: Secondary | ICD-10-CM | POA: Diagnosis not present

## 2017-12-24 DIAGNOSIS — Z79899 Other long term (current) drug therapy: Secondary | ICD-10-CM | POA: Insufficient documentation

## 2017-12-24 DIAGNOSIS — Z09 Encounter for follow-up examination after completed treatment for conditions other than malignant neoplasm: Secondary | ICD-10-CM

## 2017-12-24 DIAGNOSIS — H9193 Unspecified hearing loss, bilateral: Secondary | ICD-10-CM | POA: Insufficient documentation

## 2017-12-24 LAB — POCT URINALYSIS DIPSTICK
Bilirubin, UA: NEGATIVE
Glucose, UA: NEGATIVE
Ketones, UA: NEGATIVE
LEUKOCYTES UA: NEGATIVE
NITRITE UA: NEGATIVE
PH UA: 6 (ref 5.0–8.0)
Protein, UA: 30
SPEC GRAV UA: 1.02 (ref 1.010–1.025)
UROBILINOGEN UA: 0.2 U/dL

## 2017-12-24 NOTE — Progress Notes (Signed)
Paperwork for disability

## 2017-12-24 NOTE — Progress Notes (Signed)
Subjective:  Patient ID: Caitlin Banks, female    DOB: [birth year]  Age: 27 y.o. MRN: [id number]  CC: Follow-up   HPI Caitlin Banks presents for follow up. History of Treacher's-Collins syndrome w/ bilateral atresia. She is accompanied by her father. History of multiple face reconstructive surgeries. Plastic surgery procedure performed January 23.  She complaints of symptoms of frequent urination and urgency. Previous workups have included urinalysis and hgba1c which were both negative. He father presents paperwork for patient from audiologist visit with request for referral to be faxed to office, he also has paperwork related to disability federal loans.  Outpatient Medications Prior to Visit  Medication Sig Dispense Refill  . cephALEXin (KEFLEX) 250 MG capsule Take by mouth 4 (four) times daily.    Marland Kitchen acetaminophen (TYLENOL) 500 MG tablet Take 2 tablets (1,000 mg total) by mouth every 8 (eight) hours as needed for headache. (Patient not taking: Reported on 12/24/2017) 30 tablet 0  . adapalene (DIFFERIN) 0.1 % gel Apply topically at bedtime. (Patient not taking: Reported on 12/24/2017) 45 g 0  . fexofenadine-pseudoephedrine (ALLEGRA-D ALLERGY & CONGESTION) 180-240 MG 24 hr tablet Take 1 tablet by mouth daily. (Patient not taking: Reported on 12/24/2017) 24 tablet 1  . fluticasone (FLONASE) 50 MCG/ACT nasal spray Place 2 sprays into both nostrils daily. (Patient not taking: Reported on 12/24/2017) 16 g 2  . hydrocortisone cream 1 % Apply 1 application topically 2 (two) times daily as needed for itching. Apply to external nose only. (Patient not taking: Reported on 12/24/2017) 30 g 0  . ibuprofen (ADVIL,MOTRIN) 800 MG tablet Take 1 tablet (800 mg total) by mouth 3 (three) times daily. Prn pain. Additional refills from you PCP only. (Patient not taking: Reported on 12/24/2017) 20 tablet 0  . loratadine (CLARITIN) 10 MG tablet Take 1 tablet (10 mg total) by mouth daily. (Patient not taking: Reported on  12/24/2017) 30 tablet 2  . mupirocin nasal ointment (BACTROBAN) 2 % Apply in each nostril daily x 2 weeks (Patient not taking: Reported on 12/24/2017) 1 g 0  . simethicone (GAS-X) 80 MG chewable tablet Chew 1 tablet (80 mg total) by mouth every 6 (six) hours as needed for flatulence. (Patient not taking: Reported on 12/24/2017) 30 tablet 0  . Soap & Cleansers (CETAPHIL GENTLE CLEANSING) BAR Wash face twice daily morning and night. Rinse with warm water and pat dry. (Patient not taking: Reported on 12/24/2017)     No facility-administered medications prior to visit.     ROS Review of Systems  Constitutional: Negative.   Respiratory: Negative.   Genitourinary: Positive for frequency and urgency. Negative for pelvic pain.  Skin: Negative.     Objective:  BP 115/83 (BP Location: Right Arm, Patient Position: Sitting, Cuff Size: Normal)   Pulse 82   Temp 98.4 F (36.9 C) (Oral)   Resp 12   Wt 122 lb 12.8 oz (55.7 kg)   LMP 12/22/2017 (Approximate)   SpO2 100%   BMI 19.82 kg/m   BP/Weight 12/24/2017 06/01/2830 03/28/7615  Systolic BP 073 710 92  Diastolic BP 83 65 64  Wt. (Lbs) 122.8 123.4 123.2  BMI 19.82 19.92 21.82     Physical Exam  Constitutional: She appears well-developed and well-nourished.  HENT:  Audiology device to temporal lobe.  Cardiovascular: Normal rate, regular rhythm, normal heart sounds and intact distal pulses.  Pulmonary/Chest: Effort normal and breath sounds normal.  Abdominal: Soft. Bowel sounds are normal. There is tenderness (at surgical sites; recent history  of skin grafting procedure).  Genitourinary:  Genitourinary Comments: Urine dip  Skin: Skin is warm and dry.  Psychiatric: She has a normal mood and affect.  Nursing note and vitals reviewed.    Assessment & Plan:   1. Lower urinary tract symptoms Chronic urinary symptom complaints.  - POCT urinalysis dipstick - Ambulatory referral to Urology  2. Treacher Collins syndrome -Paperwork obtain and  paperwork policy discussed. Plan to for pickup with paperwork is ready.  - Ambulatory referral to Audiology  3. Bilateral hearing loss, unspecified hearing loss type  - Ambulatory referral to Audiology  4. Follow up       Follow-up: Return As needed .   Alfonse Spruce FNP

## 2017-12-24 NOTE — Patient Instructions (Signed)

## 2017-12-27 ENCOUNTER — Telehealth: Payer: Self-pay | Admitting: Family Medicine

## 2017-12-27 NOTE — Telephone Encounter (Signed)
Patients father stopped by to pick up paperwork given to the [place] to fill out. Patients father stated he was told the paperwork would be ready Thursday 12/27/17 or Friday 12/28/17. Please fu. A good call back number when paperwork is ready is [phone].

## 2017-12-28 NOTE — Telephone Encounter (Signed)
Paperwork available for pick up at front office.

## 2018-01-08 ENCOUNTER — Telehealth: Payer: Self-pay | Admitting: Family Medicine

## 2018-01-08 NOTE — Telephone Encounter (Signed)
Pt need to speak with the pcp since got some question about the referral, please follow up

## 2018-01-10 NOTE — Telephone Encounter (Signed)
Spoke with patients father who is her emergency contact regarding referral. Referral was placed 12/24/17. [facility]. Patient's father given contact info.for follow up.

## 2018-02-22 ENCOUNTER — Ambulatory Visit: Payer: Medicare Other | Attending: Family Medicine | Admitting: Family Medicine

## 2018-02-22 ENCOUNTER — Encounter: Payer: Self-pay | Admitting: Family Medicine

## 2018-02-22 ENCOUNTER — Other Ambulatory Visit: Payer: Self-pay

## 2018-02-22 VITALS — BP 114/68 | HR 79 | Temp 98.0°F | Resp 18 | Ht 66.0 in | Wt 121.8 lb

## 2018-02-22 DIAGNOSIS — R350 Frequency of micturition: Secondary | ICD-10-CM | POA: Insufficient documentation

## 2018-02-22 DIAGNOSIS — Z9889 Other specified postprocedural states: Secondary | ICD-10-CM | POA: Diagnosis not present

## 2018-02-22 DIAGNOSIS — H9190 Unspecified hearing loss, unspecified ear: Secondary | ICD-10-CM | POA: Insufficient documentation

## 2018-02-22 DIAGNOSIS — Q754 Mandibulofacial dysostosis: Secondary | ICD-10-CM

## 2018-02-22 NOTE — Progress Notes (Signed)
Subjective:  Patient ID: Caitlin Banks, female    DOB: Sep 07, 1991  Age: 27 y.o. MRN: [id number]  CC: Follow-up   HPI Caitlin Banks is a 27 year old female with a history of Treacher Collin syndrome status post multiple of his reconstructive surgeries) previously followed by Fredia Beets FNP who presents to the clinic to get established with me. She currently has a hearing device which assists with her hearing impairment and endorses urinary frequency needing frequent bathroom breaks for which she has been referred to urology at the last visit with her PCP. Currently a student of [employer]. She has a disability form which she would like completed.  Past Medical History:  Diagnosis Date  . Treacher Collins syndrome     Past Surgical History:  Procedure Laterality Date  . EXTERNAL EAR SURGERY      History reviewed. No pertinent family history.  No Known Allergies   Outpatient Medications Prior to Visit  Medication Sig Dispense Refill  . acetaminophen (TYLENOL) 500 MG tablet Take 2 tablets (1,000 mg total) by mouth every 8 (eight) hours as needed for headache. (Patient not taking: Reported on 12/24/2017) 30 tablet 0  . adapalene (DIFFERIN) 0.1 % gel Apply topically at bedtime. (Patient not taking: Reported on 12/24/2017) 45 g 0  . cephALEXin (KEFLEX) 250 MG capsule Take by mouth 4 (four) times daily.    . fexofenadine-pseudoephedrine (ALLEGRA-D ALLERGY & CONGESTION) 180-240 MG 24 hr tablet Take 1 tablet by mouth daily. (Patient not taking: Reported on 12/24/2017) 24 tablet 1  . fluticasone (FLONASE) 50 MCG/ACT nasal spray Place 2 sprays into both nostrils daily. (Patient not taking: Reported on 12/24/2017) 16 g 2  . hydrocortisone cream 1 % Apply 1 application topically 2 (two) times daily as needed for itching. Apply to external nose only. (Patient not taking: Reported on 12/24/2017) 30 g 0  . ibuprofen (ADVIL,MOTRIN) 800 MG tablet  Take 1 tablet (800 mg total) by mouth 3 (three) times daily. Prn pain. Additional refills from you PCP only. (Patient not taking: Reported on 12/24/2017) 20 tablet 0  . loratadine (CLARITIN) 10 MG tablet Take 1 tablet (10 mg total) by mouth daily. (Patient not taking: Reported on 12/24/2017) 30 tablet 2  . mupirocin nasal ointment (BACTROBAN) 2 % Apply in each nostril daily x 2 weeks (Patient not taking: Reported on 12/24/2017) 1 g 0  . simethicone (GAS-X) 80 MG chewable tablet Chew 1 tablet (80 mg total) by mouth every 6 (six) hours as needed for flatulence. (Patient not taking: Reported on 12/24/2017) 30 tablet 0  . Soap & Cleansers (CETAPHIL GENTLE CLEANSING) BAR Wash face twice daily morning and night. Rinse with warm water and pat dry. (Patient not taking: Reported on 12/24/2017)     No facility-administered medications prior to visit.     ROS Review of Systems  Constitutional: Negative for activity change, appetite change and fatigue.  HENT: Negative for congestion, sinus pressure and sore throat.   Eyes: Negative for visual disturbance.  Respiratory: Negative for cough, chest tightness, shortness of breath and wheezing.   Cardiovascular: Negative for chest pain and palpitations.  Gastrointestinal: Negative for abdominal distention, abdominal pain and constipation.  Endocrine: Negative for polydipsia.  Genitourinary: Positive for frequency. Negative for dysuria.  Musculoskeletal: Negative for arthralgias and back pain.  Skin: Negative for rash.  Neurological: Negative for tremors, light-headedness and numbness.  Hematological: Does not bruise/bleed easily.  Psychiatric/Behavioral: Negative for agitation and behavioral problems.  Objective:  BP 114/68 (BP Location: Left Arm, Patient Position: Sitting, Cuff Size: Normal)   Pulse 79   Temp 98 F (36.7 C) (Oral)   Resp 18   Ht 5\' 6"  (1.676 m)   Wt 121 lb 12.8 oz (55.2 kg)   SpO2 99%   BMI 19.66 kg/m   BP/Weight 02/22/2018  04/05/2584 01/03/7823  Systolic BP 235 361 443  Diastolic BP 68 83 65  Wt. (Lbs) 121.8 122.8 123.4  BMI 19.66 19.82 19.92      Physical Exam  Constitutional: She is oriented to person, place, and time. She appears well-developed and well-nourished.  HENT:  Craniofacial disproportion Audiology device in place  Cardiovascular: Normal rate, normal heart sounds and intact distal pulses.  No murmur heard. Pulmonary/Chest: Effort normal and breath sounds normal. She has no wheezes. She has no rales. She exhibits no tenderness.  Abdominal: Soft. Bowel sounds are normal. She exhibits no distension and no mass. There is no tenderness.  Musculoskeletal: Normal range of motion.  Neurological: She is alert and oriented to person, place, and time.  Skin: Skin is warm and dry.  Psychiatric: She has a normal mood and affect.     Assessment & Plan:   1. Treacher Collins syndrome With urinary frequency requiring frequent bathroom breaks Currently has an audiology device in place Currently a student of [employer] and is doing well - I have completed the form for disability indicating she is functional but that her environment will need to be modified to accommodate her disability.   No orders of the defined types were placed in this encounter.   Follow-up: Return in about 6 months (around 08/25/2018) for coordination of care.   Charlott Rakes MD

## 2018-02-27 ENCOUNTER — Telehealth: Payer: Self-pay | Admitting: Family Medicine

## 2018-02-27 NOTE — Telephone Encounter (Signed)
[facility] sent OV note, fax will be on the pcp in-box

## 2018-03-27 ENCOUNTER — Ambulatory Visit: Payer: Medicare Other | Attending: Family Medicine | Admitting: Audiology

## 2018-03-27 DIAGNOSIS — Z9621 Cochlear implant status: Secondary | ICD-10-CM

## 2018-03-27 NOTE — Progress Notes (Signed)
Caitlin Banks arrived at [facility] audiology today with a referral form Ms Braulio Conte. However, we cannot reprogram her cochlear implant here-which is what she needs.   DAYANI WINBUSH has a "29 year old hearing aid" that was last programmed "two years ago". [facility] has been following her and take Medicaid. I called them and set up an appointment that was convenient for the family for Apr 12, 2018 at 1:30pm. However, they need a referral her Charlott Rakes, MD  Charlott Rakes, MD was contacted via email with a request made to please send a referral to [facility] for an audiological evaluation with cochlear implant programming. Their contact info is:   Fax [phone]  Phone [phone].    Luisenrique Conran L. Heide Spark, Au.D., CCC-A Doctor of Audiology 03/27/2018

## 2018-03-29 ENCOUNTER — Other Ambulatory Visit: Payer: Self-pay | Admitting: Family Medicine

## 2018-03-29 DIAGNOSIS — H9190 Unspecified hearing loss, unspecified ear: Secondary | ICD-10-CM

## 2018-04-09 ENCOUNTER — Telehealth: Payer: Self-pay | Admitting: Family Medicine

## 2018-04-09 NOTE — Telephone Encounter (Signed)
I provided the patient with the original form I completed at her office visit which they can submit; there is no indication for completing another one.

## 2018-04-09 NOTE — Telephone Encounter (Signed)
Patient and patients father came in stating that they need the disability forms filled since [employer] sent the paper back stating that the information given was incomplete or unclear. It needs to be complete by 06/01/2018.  Please follow up [phone]

## 2018-04-19 ENCOUNTER — Telehealth: Payer: Self-pay | Admitting: Family Medicine

## 2018-04-19 NOTE — Telephone Encounter (Signed)
Patient father came in looking for paper work and would like you to call him back.

## 2018-04-19 NOTE — Telephone Encounter (Signed)
Patient was called and informed to bring paperwork to be filled out.

## 2018-04-29 NOTE — Telephone Encounter (Signed)
Patient came in this morning to drop off disability paperwork to be filled out by PCP. Paperwork will be put in PCP box, and pt.would like paperwork faxed to the company.  Please f/u

## 2018-04-30 NOTE — Telephone Encounter (Signed)
Paperwork has been received. 

## 2018-05-14 NOTE — Telephone Encounter (Signed)
Patients father picked up a copy and verbalized understating regarding paperwork being faxed to disability office.

## 2018-07-02 ENCOUNTER — Encounter: Payer: Self-pay | Admitting: Family Medicine

## 2018-07-02 ENCOUNTER — Ambulatory Visit: Payer: Medicare Other | Attending: Family Medicine | Admitting: Family Medicine

## 2018-07-02 VITALS — BP 106/69 | HR 74 | Temp 98.2°F | Ht 66.0 in | Wt 125.0 lb

## 2018-07-02 DIAGNOSIS — Z792 Long term (current) use of antibiotics: Secondary | ICD-10-CM | POA: Insufficient documentation

## 2018-07-02 DIAGNOSIS — Z7952 Long term (current) use of systemic steroids: Secondary | ICD-10-CM | POA: Diagnosis not present

## 2018-07-02 DIAGNOSIS — Z79899 Other long term (current) drug therapy: Secondary | ICD-10-CM | POA: Diagnosis not present

## 2018-07-02 DIAGNOSIS — Q754 Mandibulofacial dysostosis: Secondary | ICD-10-CM

## 2018-07-02 DIAGNOSIS — Z09 Encounter for follow-up examination after completed treatment for conditions other than malignant neoplasm: Secondary | ICD-10-CM | POA: Insufficient documentation

## 2018-07-02 DIAGNOSIS — L0390 Cellulitis, unspecified: Secondary | ICD-10-CM | POA: Diagnosis not present

## 2018-07-02 DIAGNOSIS — Z13228 Encounter for screening for other metabolic disorders: Secondary | ICD-10-CM | POA: Diagnosis not present

## 2018-07-02 DIAGNOSIS — H902 Conductive hearing loss, unspecified: Secondary | ICD-10-CM | POA: Diagnosis not present

## 2018-07-02 DIAGNOSIS — Z9889 Other specified postprocedural states: Secondary | ICD-10-CM | POA: Insufficient documentation

## 2018-07-02 MED ORDER — CEPHALEXIN 500 MG PO CAPS: 500.0000 mg | ORAL_CAPSULE | Freq: Two times a day (BID) | ORAL | 0 refills | Status: DC

## 2018-07-02 NOTE — Progress Notes (Signed)
Subjective:  Patient ID: Caitlin Banks, female    DOB: June 13, 1991  Age: 27 y.o. 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 She complains of intermittent tenderness at the site of her cochlear implant but denies discharge from site or fevers and symptoms are absent at this time. She currently undergoes speech therapy at [facility] and was last seen by audiology 6 weeks ago.  Past Medical History:  Diagnosis Date  . Treacher Collins syndrome     Past Surgical History:  Procedure Laterality Date  . EXTERNAL EAR SURGERY      No Known Allergies   Outpatient Medications Prior to Visit  Medication Sig Dispense Refill  . fluticasone (FLONASE) 50 MCG/ACT nasal spray Place 2 sprays into both nostrils daily. 16 g 2  . loratadine (CLARITIN) 10 MG tablet Take 1 tablet (10 mg total) by mouth daily. 30 tablet 2  . adapalene (DIFFERIN) 0.1 % gel Apply topically at bedtime. (Patient not taking: Reported on 12/24/2017) 45 g 0  . acetaminophen (TYLENOL) 500 MG tablet Take 2 tablets (1,000 mg total) by mouth every 8 (eight) hours as needed for headache. (Patient not taking: Reported on 12/24/2017) 30 tablet 0  . cephALEXin (KEFLEX) 250 MG capsule Take by mouth 4 (four) times daily.    . fexofenadine-pseudoephedrine (ALLEGRA-D ALLERGY & CONGESTION) 180-240 MG 24 hr tablet Take 1 tablet by mouth daily. (Patient not taking: Reported on 12/24/2017) 24 tablet 1  . hydrocortisone cream 1 % Apply 1 application topically 2 (two) times daily as needed for itching. Apply to external nose only. (Patient not taking: Reported on 12/24/2017) 30 g 0  . ibuprofen (ADVIL,MOTRIN) 800 MG tablet Take 1 tablet (800 mg total) by mouth 3 (three) times daily. Prn pain. Additional refills  from you PCP only. (Patient not taking: Reported on 12/24/2017) 20 tablet 0  . mupirocin nasal ointment (BACTROBAN) 2 % Apply in each nostril daily x 2 weeks (Patient not taking: Reported on 12/24/2017) 1 g 0  . simethicone (GAS-X) 80 MG chewable tablet Chew 1 tablet (80 mg total) by mouth every 6 (six) hours as needed for flatulence. (Patient not taking: Reported on 12/24/2017) 30 tablet 0  . Soap & Cleansers (CETAPHIL GENTLE CLEANSING) BAR Wash face twice daily morning and night. Rinse with warm water and pat dry. (Patient not taking: Reported on 12/24/2017)     No facility-administered medications prior to visit.     ROS Review of Systems  Constitutional: Negative for activity change, appetite change and fatigue.  HENT: Positive for hearing loss. Negative for congestion, sinus pressure and sore throat.   Eyes: Negative for visual disturbance.  Respiratory: Negative for cough, chest tightness, shortness of breath and wheezing.   Cardiovascular: Negative for chest pain and palpitations.  Gastrointestinal: Negative for abdominal distention, abdominal pain and constipation.  Endocrine: Negative for polydipsia.  Genitourinary: Negative for dysuria and frequency.  Musculoskeletal: Negative for arthralgias and back pain.  Skin: Negative for rash.  Neurological: Negative for tremors, light-headedness and numbness.  Hematological: Does not bruise/bleed easily.  Psychiatric/Behavioral: Negative for agitation and behavioral problems.    Objective:  BP 106/69   Pulse 74   Temp 98.2 F (36.8 C) (Oral)   Ht '5\' 6"'$  (1.676 m)   Wt 125 lb (56.7 kg)  SpO2 100%   BMI 20.18 kg/m   BP/Weight 07/02/2018 02/22/2018 7/71/1657  Systolic BP 903 833 383  Diastolic BP 69 68 83  Wt. (Lbs) 125 121.8 122.8  BMI 20.18 19.66 19.82      Physical Exam  Constitutional: She is oriented to person, place, and time. She appears well-developed and well-nourished.  HENT:  Left ear atresia Atresia of right  eustachian tube; cochlear implant and posterior auricular region with no evidence of tenderness or drainage.  Cardiovascular: Normal rate, normal heart sounds and intact distal pulses.  No murmur heard. Pulmonary/Chest: Effort normal and breath sounds normal. She has no wheezes. She has no rales. She exhibits no tenderness.  Abdominal: Soft. Bowel sounds are normal. She exhibits no distension and no mass. There is no tenderness.  Musculoskeletal: Normal range of motion.  Neurological: She is alert and oriented to person, place, and time.     Assessment & Plan:   1. Cellulitis, unspecified cellulitis site Placed on Keflex presumptively Advised to follow-up with ENT [facility] if symptoms persist - CBC with Differential/Platelet  2. Screening for metabolic disorder - ANV91+YOMA  3. Treacher Collins syndrome With hearing loss and atresia status post right cochlear implant Continue speech therapy  Healthcare maintenance-declines Pap smear despite discussing implications of her decision  Meds ordered this encounter  Medications  . cephALEXin (KEFLEX) 500 MG capsule    Sig: Take 1 capsule (500 mg total) by mouth 2 (two) times daily.    Dispense:  14 capsule    Refill:  0    Follow-up: Return in about 1 year (around 07/03/2019) for Complete physical exam.   Charlott Rakes MD

## 2018-07-03 LAB — CMP14+EGFR
A/G RATIO: 1.7 (ref 1.2–2.2)
ALK PHOS: 43 IU/L (ref 39–117)
ALT: 19 IU/L (ref 0–32)
AST: 19 IU/L (ref 0–40)
Albumin: 4.7 g/dL (ref 3.5–5.5)
BILIRUBIN TOTAL: 0.3 mg/dL (ref 0.0–1.2)
BUN / CREAT RATIO: 18 (ref 9–23)
BUN: 17 mg/dL (ref 6–20)
CHLORIDE: 102 mmol/L (ref 96–106)
CO2: 22 mmol/L (ref 20–29)
Calcium: 9.8 mg/dL (ref 8.7–10.2)
Creatinine, Ser: 0.97 mg/dL (ref 0.57–1.00)
GFR calc Af Amer: 91 mL/min/{1.73_m2} (ref >59)
GFR calc non Af Amer: 79 mL/min/{1.73_m2} (ref >59)
Globulin, Total: 2.7 g/dL (ref 1.5–4.5)
Glucose: 60 mg/dL — ABNORMAL LOW (ref 65–99)
POTASSIUM: 4.1 mmol/L (ref 3.5–5.2)
Sodium: 141 mmol/L (ref 134–144)
Total Protein: 7.4 g/dL (ref 6.0–8.5)

## 2018-07-03 LAB — CBC WITH DIFFERENTIAL/PLATELET
BASOS ABS: 0 10*3/uL (ref 0.0–0.2)
Basos: 0 %
EOS (ABSOLUTE): 0.1 10*3/uL (ref 0.0–0.4)
Eos: 1 %
Hematocrit: 40.8 % (ref 34.0–46.6)
Hemoglobin: 13.1 g/dL (ref 11.1–15.9)
Immature Grans (Abs): 0 10*3/uL (ref 0.0–0.1)
Immature Granulocytes: 0 %
LYMPHS ABS: 2 10*3/uL (ref 0.7–3.1)
Lymphs: 42 %
MCH: 27 pg (ref 26.6–33.0)
MCHC: 32.1 g/dL (ref 31.5–35.7)
MCV: 84 fL (ref 79–97)
MONOCYTES: 12 %
Monocytes Absolute: 0.6 10*3/uL (ref 0.1–0.9)
NEUTROS ABS: 2.1 10*3/uL (ref 1.4–7.0)
Neutrophils: 45 %
PLATELETS: 201 10*3/uL (ref 150–450)
RBC: 4.85 x10E6/uL (ref 3.77–5.28)
RDW: 13.8 % (ref 12.3–15.4)
WBC: 4.7 10*3/uL (ref 3.4–10.8)

## 2018-07-10 ENCOUNTER — Telehealth: Payer: Self-pay | Admitting: *Deleted

## 2018-07-10 NOTE — Telephone Encounter (Signed)
Left message on voicemail to return call.   Notes recorded by Charlott Rakes, MD on 07/03/2018 at 1:31 PM EDT Please inform the patient that labs are normal. Thank you.

## 2018-07-18 ENCOUNTER — Telehealth: Payer: Self-pay

## 2018-07-18 NOTE — Telephone Encounter (Signed)
Patient was called and informed to contact office for lab results.   If patient returns phone call please inform patient of lab below.

## 2018-07-18 NOTE — Telephone Encounter (Signed)
-----   Message from Charlott Rakes, MD sent at 07/03/2018  1:31 PM EDT ----- Please inform the patient that labs are normal. Thank you.

## 2018-07-18 NOTE — Telephone Encounter (Signed)
Patients father called and I informed him that the labs are normal.

## 2018-09-05 DIAGNOSIS — H900 Conductive hearing loss, bilateral: Secondary | ICD-10-CM | POA: Diagnosis not present

## 2018-09-05 DIAGNOSIS — Q172 Microtia: Secondary | ICD-10-CM | POA: Diagnosis not present

## 2018-09-05 DIAGNOSIS — Q754 Mandibulofacial dysostosis: Secondary | ICD-10-CM | POA: Diagnosis not present

## 2018-09-05 DIAGNOSIS — H9012 Conductive hearing loss, unilateral, left ear, with unrestricted hearing on the contralateral side: Secondary | ICD-10-CM | POA: Diagnosis not present

## 2018-09-25 DIAGNOSIS — H902 Conductive hearing loss, unspecified: Secondary | ICD-10-CM | POA: Diagnosis not present

## 2018-09-25 DIAGNOSIS — R350 Frequency of micturition: Secondary | ICD-10-CM | POA: Diagnosis not present

## 2018-09-25 DIAGNOSIS — Q754 Mandibulofacial dysostosis: Secondary | ICD-10-CM | POA: Diagnosis not present

## 2018-10-01 DIAGNOSIS — Q172 Microtia: Secondary | ICD-10-CM | POA: Diagnosis not present

## 2018-10-01 DIAGNOSIS — Q754 Mandibulofacial dysostosis: Secondary | ICD-10-CM | POA: Diagnosis not present

## 2018-10-18 DIAGNOSIS — H900 Conductive hearing loss, bilateral: Secondary | ICD-10-CM | POA: Diagnosis not present

## 2018-10-31 DIAGNOSIS — H900 Conductive hearing loss, bilateral: Secondary | ICD-10-CM | POA: Diagnosis not present

## 2018-11-06 ENCOUNTER — Ambulatory Visit: Payer: Medicare Other | Admitting: Family Medicine

## 2018-11-14 DIAGNOSIS — H900 Conductive hearing loss, bilateral: Secondary | ICD-10-CM | POA: Diagnosis not present

## 2018-11-22 ENCOUNTER — Ambulatory Visit (HOSPITAL_BASED_OUTPATIENT_CLINIC_OR_DEPARTMENT_OTHER): Payer: Medicare Other | Admitting: Physician Assistant

## 2018-11-22 ENCOUNTER — Other Ambulatory Visit (HOSPITAL_COMMUNITY)
Admission: RE | Admit: 2018-11-22 | Discharge: 2018-11-22 | Disposition: A | Discharge status: Home / Self Care | Payer: Medicare Other | Source: Ambulatory Visit | Attending: Family Medicine | Admitting: Family Medicine

## 2018-11-22 ENCOUNTER — Other Ambulatory Visit: Payer: Self-pay

## 2018-11-22 VITALS — BP 107/73 | HR 84 | Temp 98.8°F | Resp 16

## 2018-11-22 DIAGNOSIS — N898 Other specified noninflammatory disorders of vagina: Secondary | ICD-10-CM | POA: Insufficient documentation

## 2018-11-22 DIAGNOSIS — Z1389 Encounter for screening for other disorder: Secondary | ICD-10-CM

## 2018-11-22 DIAGNOSIS — Z79899 Other long term (current) drug therapy: Secondary | ICD-10-CM | POA: Insufficient documentation

## 2018-11-22 DIAGNOSIS — Q754 Mandibulofacial dysostosis: Secondary | ICD-10-CM

## 2018-11-22 LAB — POCT URINALYSIS DIP (CLINITEK)
Bilirubin, UA: NEGATIVE
Glucose, UA: NEGATIVE mg/dL
Leukocytes, UA: NEGATIVE
Nitrite, UA: NEGATIVE
POC PROTEIN,UA: NEGATIVE
RBC UA: NEGATIVE
SPEC GRAV UA: 1.02 (ref 1.010–1.025)
UROBILINOGEN UA: 0.2 U/dL
pH, UA: 5 (ref 5.0–8.0)

## 2018-11-22 MED ORDER — FLUCONAZOLE 150 MG PO TABS: 150.0000 mg | ORAL_TABLET | Freq: Once | ORAL | 0 refills | Status: AC

## 2018-11-22 NOTE — Patient Instructions (Signed)
Drink 80-100 ounces water daily as your urine is too concentrated.    Dehydration, Adult  Dehydration is when there is not enough fluid or water in your body. This happens when you lose more fluids than you take in. Dehydration can range from mild to very bad. It should be treated right away to keep it from getting very bad. Symptoms of mild dehydration may include:  Thirst.  Dry lips.  Slightly dry mouth.  Dry, warm skin.  Dizziness. Symptoms of moderate dehydration may include:  Very dry mouth.  Muscle cramps.  Dark pee (urine). Pee may be the color of tea.  Your body making less pee.  Your eyes making fewer tears.  Heartbeat that is uneven or faster than normal (palpitations).  Headache.  Light-headedness, especially when you stand up from sitting.  Fainting (syncope). Symptoms of very bad dehydration may include:  Changes in skin, such as: ? Cold and clammy skin. ? Blotchy (mottled) or pale skin. ? Skin that does not quickly return to normal after being lightly pinched and let go (poor skin turgor).  Changes in body fluids, such as: ? Feeling very thirsty. ? Your eyes making fewer tears. ? Not sweating when body temperature is high, such as in hot weather. ? Your body making very little pee.  Changes in vital signs, such as: ? Weak pulse. ? Pulse that is more than 100 beats a minute when you are sitting still. ? Fast breathing. ? Low blood pressure.  Other changes, such as: ? Sunken eyes. ? Cold hands and feet. ? Confusion. ? Lack of energy (lethargy). ? Trouble waking up from sleep. ? Short-term weight loss. ? Unconsciousness. Follow these instructions at home:   If told by your doctor, drink an ORS: ? Make an ORS by using instructions on the package. ? Start by drinking small amounts, about  cup (120 mL) every 5-10 minutes. ? Slowly drink more until you have had the amount that your doctor said to have.  Drink enough clear fluid to keep  your pee clear or pale yellow. If you were told to drink an ORS, finish the ORS first, then start slowly drinking clear fluids. Drink fluids such as: ? Water. Do not drink only water by itself. Doing that can make the salt (sodium) level in your body get too low (hyponatremia). ? Ice chips. ? Fruit juice that you have added water to (diluted). ? Low-calorie sports drinks.  Avoid: ? Alcohol. ? Drinks that have a lot of sugar. These include high-calorie sports drinks, fruit juice that does not have water added, and soda. ? Caffeine. ? Foods that are greasy or have a lot of fat or sugar.  Take over-the-counter and prescription medicines only as told by your doctor.  Do not take salt tablets. Doing that can make the salt level in your body get too high (hypernatremia).  Eat foods that have minerals (electrolytes). Examples include bananas, oranges, potatoes, tomatoes, and spinach.  Keep all follow-up visits as told by your doctor. This is important. Contact a doctor if:  You have belly (abdominal) pain that: ? Gets worse. ? Stays in one area (localizes).  You have a rash.  You have a stiff neck.  You get angry or annoyed more easily than normal (irritability).  You are more sleepy than normal.  You have a harder time waking up than normal.  You feel: ? Weak. ? Dizzy. ? Very thirsty.  You have peed (urinated) only a small amount of  very dark pee during 6-8 hours. Get help right away if:  You have symptoms of very bad dehydration.  You cannot drink fluids without throwing up (vomiting).  Your symptoms get worse with treatment.  You have a fever.  You have a very bad headache.  You are throwing up or having watery poop (diarrhea) and it: ? Gets worse. ? Does not go away.  You have blood or something green (bile) in your throw-up.  You have blood in your poop (stool). This may cause poop to look black and tarry.  You have not peed in 6-8 hours.  You pass out  (faint).  Your heart rate when you are sitting still is more than 100 beats a minute.  You have trouble breathing. This information is not intended to replace advice given to you by your health care provider. Make sure you discuss any questions you have with your health care provider. Document Released: 09/09/2009 Document Revised: 06/02/2016 Document Reviewed: 01/07/2016 Elsevier Interactive Patient Education  2019 [employer].

## 2018-11-22 NOTE — Progress Notes (Signed)
Feels like she has a yeast infection x 1 week  Light brown discharge

## 2018-11-22 NOTE — Progress Notes (Signed)
Patient ID: Caitlin Banks, female   DOB: 08-21-1991, 27 y.o.   MRN: [id number]      Caitlin Banks, is a 27 y.o. female  AJO:[id number]  NOB:[id number]  DOB - 1991/06/15  Subjective:  Chief Complaint and HPI: Caitlin Banks is a 27 y.o. female here today and feels like she has a yeast infection.  Some whitish vaginal discharge and burning and itching.  She had to take an antibiotic not too long ago. Recent bone anchoring surgery in the ear for future implant.  Denies STI risk factors.  No fever or abdominal pain.    ROS:   Constitutional:  No f/c, No night sweats, No unexplained weight loss. EENT:  No vision changes, No blurry vision, No hearing changes. No mouth, throat, or ear problems.  Respiratory: No cough, No SOB Cardiac: No CP, no palpitations GI:  No abd pain, No N/V/D. GU: No Urinary s/sx Musculoskeletal: No joint pain Neuro: No headache, no dizziness, no motor weakness.  Skin: No rash Endocrine:  No polydipsia. No polyuria.  Psych: Denies SI/HI  No problems updated.  ALLERGIES: No Known Allergies  PAST MEDICAL HISTORY: Past Medical History:  Diagnosis Date  . Treacher Collins syndrome     MEDICATIONS AT HOME: Prior to Admission medications   Medication Sig Start Date End Date Taking? Authorizing Provider  fluconazole (DIFLUCAN) 150 MG tablet Take 1 tablet (150 mg total) by mouth once for 1 dose. 11/22/18 11/22/18  Argentina Donovan, PA-C  fluticasone (FLONASE) 50 MCG/ACT nasal spray Place 2 sprays into both nostrils daily. 03/12/17   Alfonse Spruce, FNP  loratadine (CLARITIN) 10 MG tablet Take 1 tablet (10 mg total) by mouth daily. 03/12/17   Alfonse Spruce, FNP     Objective:  EXAM:   Vitals:   11/22/18 1010  BP: 107/73  Pulse: 84  Resp: 16  Temp: 98.8 F (37.1 C)  TempSrc: Oral  SpO2: 100%    General appearance : A&OX3. NAD. Non-toxic-appearing HEENT: Atraumatic and Normocephalic.  PERRLA. EOM intact.   Neck: supple, no JVD. No  cervical lymphadenopathy. No thyromegaly Chest/Lungs:  Breathing-non-labored, Good air entry bilaterally, breath sounds normal without rales, rhonchi, or wheezing  CVS: S1 S2 regular, no murmurs, gallops, rubs  Extremities: Bilateral Lower Ext shows no edema, both legs are warm to touch with = pulse throughout Neurology:  CN II-XII grossly intact, Non focal.   Psych:  TP linear. J/I WNL. Speech impediment present. Appropriate eye contact and affect.  Skin:  No Rash  Data Review Lab Results  Component Value Date   HGBA1C 5.0 11/30/2017     Assessment & Plan   1. Vaginal discharge Will cover for yeast.  Drink 80-100 ounces water daily as there were ketones in her urine.   - POCT URINALYSIS DIP (CLINITEK) - fluconazole (DIFLUCAN) 150 MG tablet; Take 1 tablet (150 mg total) by mouth once for 1 dose.  Dispense: 1 tablet; Refill: 0 - Urine cytology ancillary only   Patient have been counseled extensively about nutrition and exercise  Return if symptoms worsen or fail to improve.  The patient was given clear instructions to go to ER or return to medical center if symptoms don't improve, worsen or new problems develop. The patient verbalized understanding. The patient was told to call to get lab results if they haven't heard anything in the next week.     Freeman Caldron, PA-C [facility] and [facility], [place]   11/22/2018, 10:44  AM

## 2018-11-25 LAB — URINE CYTOLOGY ANCILLARY ONLY
Bacterial vaginitis: NEGATIVE
CHLAMYDIA, DNA PROBE: NEGATIVE
Candida vaginitis: NEGATIVE
NEISSERIA GONORRHEA: NEGATIVE
TRICH (WINDOWPATH): NEGATIVE

## 2018-11-26 ENCOUNTER — Telehealth: Payer: Self-pay | Admitting: *Deleted

## 2018-11-26 NOTE — Telephone Encounter (Signed)
Notes recorded by Carilyn Goodpasture, RN on 11/26/2018 at 12:28 PM EST Left message on voicemail to return call.   ------  Notes recorded by Argentina Donovan, PA-C on 11/26/2018 at 7:54 AM EST Please call patient. Testing was negative for bacterial vaginosis, yeast infection, chlamydia, gonorrhea, and trichomonas. Follow-up as needed. Thanks, Freeman Caldron, PA-C

## 2018-12-02 NOTE — Telephone Encounter (Signed)
Pt called for lab results, nurse was not available. please follow up to [phone]

## 2018-12-03 ENCOUNTER — Telehealth (INDEPENDENT_AMBULATORY_CARE_PROVIDER_SITE_OTHER): Payer: Self-pay

## 2018-12-03 NOTE — Telephone Encounter (Signed)
Patient verified DOB. She is aware that all results were negative and to follow up as needed. Nat Christen, CMA

## 2018-12-03 NOTE — Telephone Encounter (Signed)
-----   Message from Argentina Donovan, [place] sent at 11/26/2018  7:54 AM EST ----- Please call patient.  Testing was negative for bacterial vaginosis, yeast infection, chlamydia, gonorrhea, and trichomonas.  Follow-up as needed.  Thanks, Freeman Caldron, PA-C

## 2018-12-12 DIAGNOSIS — H900 Conductive hearing loss, bilateral: Secondary | ICD-10-CM | POA: Diagnosis not present

## 2018-12-17 DIAGNOSIS — Q172 Microtia: Secondary | ICD-10-CM | POA: Diagnosis not present

## 2019-01-07 DIAGNOSIS — Q172 Microtia: Secondary | ICD-10-CM | POA: Diagnosis not present

## 2019-01-21 DIAGNOSIS — H900 Conductive hearing loss, bilateral: Secondary | ICD-10-CM | POA: Diagnosis not present

## 2019-01-21 DIAGNOSIS — Z48810 Encounter for surgical aftercare following surgery on the sense organs: Secondary | ICD-10-CM | POA: Diagnosis not present

## 2019-01-21 DIAGNOSIS — Q172 Microtia: Secondary | ICD-10-CM | POA: Diagnosis not present

## 2019-01-22 ENCOUNTER — Encounter: Payer: Self-pay | Admitting: Family Medicine

## 2019-01-22 ENCOUNTER — Ambulatory Visit (HOSPITAL_BASED_OUTPATIENT_CLINIC_OR_DEPARTMENT_OTHER): Payer: Medicare Other | Admitting: Family Medicine

## 2019-01-22 ENCOUNTER — Other Ambulatory Visit (HOSPITAL_COMMUNITY)
Admission: RE | Admit: 2019-01-22 | Discharge: 2019-01-22 | Disposition: A | Discharge status: Home / Self Care | Payer: Medicare Other | Source: Ambulatory Visit | Attending: Family Medicine | Admitting: Family Medicine

## 2019-01-22 VITALS — BP 130/70 | HR 87 | Temp 98.6°F | Ht 66.0 in | Wt 124.0 lb

## 2019-01-22 DIAGNOSIS — N898 Other specified noninflammatory disorders of vagina: Secondary | ICD-10-CM

## 2019-01-22 DIAGNOSIS — Z23 Encounter for immunization: Secondary | ICD-10-CM | POA: Diagnosis not present

## 2019-01-22 NOTE — Progress Notes (Signed)
Subjective:  Patient ID: Caitlin Banks, female    DOB: October 14, 1991  Age: 28 y.o. 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 She thinks she has a yeast infection. Denies vaginal itching, abdominal pain, dysuria. She is not sexually active.  Past Medical History:  Diagnosis Date  . Treacher Collins syndrome     Past Surgical History:  Procedure Laterality Date  . EXTERNAL EAR SURGERY      History reviewed. No pertinent family history.  No Known Allergies  Outpatient Medications Prior to Visit  Medication Sig Dispense Refill  . fluticasone (FLONASE) 50 MCG/ACT nasal spray Place 2 sprays into both nostrils daily. 16 g 2  . loratadine (CLARITIN) 10 MG tablet Take 1 tablet (10 mg total) by mouth daily. 30 tablet 2   No facility-administered medications prior to visit.      ROS Review of Systems General: negative for fever, weight loss, appetite change Eyes: no visual symptoms. ENT: no ear symptoms, no sinus tenderness, no nasal congestion or sore throat. Neck: no pain  Respiratory: no wheezing, shortness of breath, cough Cardiovascular: no chest pain, no dyspnea on exertion, no pedal edema, no orthopnea. Gastrointestinal: no abdominal pain, no diarrhea, no constipation Genito-Urinary: no urinary frequency, no dysuria, no polyuria, +vaginal discharge Hematologic: no bruising Endocrine: no cold or heat intolerance Neurological: no headaches, no seizures, no tremors Musculoskeletal: no joint pains, no joint swelling Skin: no pruritus, no rash. Psychological: no depression, no anxiety,    Objective:  BP 130/70   Pulse 87   Temp 98.6 F (37 C) (Oral)   Ht 5\' 6"  (1.676 m)   Wt 124 lb (56.2 kg)   SpO2  100%   BMI 20.01 kg/m   BP/Weight 01/22/2019 33/82/5053 08/03/6733  Systolic BP 193 790 240  Diastolic BP 70 73 69  Wt. (Lbs) 124 - 125  BMI 20.01 - 20.18      Physical Exam Constitutional: normal appearing,  Eyes: PERRLA HEENT: Head is atraumatic, normal sinuses, normal oropharynx, normal appearing tonsils and palate, tympanic membrane is normal bilaterally. Neck: normal range of motion, no thyromegaly, no JVD Cardiovascular: normal rate and rhythm, normal heart sounds, no murmurs, rub or gallop, no pedal edema Respiratory: Normal breath sounds, clear to auscultation bilaterally, no wheezes, no rales, no rhonchi Abdomen: soft, not tender to palpation, normal bowel sounds, no enlarged organs Musculoskeletal: Full ROM, no tenderness in joints Skin: warm and dry, no lesions. Neurological: alert, oriented x3, cranial nerves I-XII grossly intact , normal motor strength, normal sensation. Psychological: normal mood.   CMP Latest Ref Rng & Units 07/02/2018  Glucose 65 - 99 mg/dL 60(L)  BUN 6 - 20 mg/dL 17  Creatinine 0.57 - 1.00 mg/dL 0.97  Sodium 134 - 144 mmol/L 141  Potassium 3.5 - 5.2 mmol/L 4.1  Chloride 96 - 106 mmol/L 102  CO2 20 - 29 mmol/L 22  Calcium 8.7 - 10.2 mg/dL 9.8  Total Protein 6.0 - 8.5 g/dL 7.4  Total Bilirubin 0.0 - 1.2 mg/dL 0.3  Alkaline Phos 39 - 117 IU/L 43  AST 0 - 40 IU/L 19  ALT 0 - 32 IU/L 19     CBC    Component Value Date/Time   WBC 4.7 07/02/2018 1429   RBC  4.85 07/02/2018 1429   HGB 13.1 07/02/2018 1429   HCT 40.8 07/02/2018 1429   PLT 201 07/02/2018 1429   MCV 84 07/02/2018 1429   MCH 27.0 07/02/2018 1429   MCHC 32.1 07/02/2018 1429   RDW 13.8 07/02/2018 1429   LYMPHSABS 2.0 07/02/2018 1429   EOSABS 0.1 07/02/2018 1429   BASOSABS 0.0 07/02/2018 1429    Lab Results  Component Value Date   HGBA1C 5.0 11/30/2017    Assessment & Plan:   1. Vaginal discharge - Urine cytology ancillary only  2. Need for immunization against  influenza - Flu Vaccine QUAD 36+ mos IM   No orders of the defined types were placed in this encounter.   Follow-up: Return in about 6 months (around 07/23/2019) for coordination of care.       Charlott Rakes, MD, FAAFP. [facility] and [place], [place]   01/22/2019, 5:47 PM

## 2019-01-22 NOTE — Progress Notes (Signed)
Patient states that she may have a yeast infection. 

## 2019-01-24 LAB — URINE CYTOLOGY ANCILLARY ONLY
Bacterial vaginitis: NEGATIVE
Candida vaginitis: POSITIVE — AB
Chlamydia: NEGATIVE
NEISSERIA GONORRHEA: NEGATIVE
Trichomonas: NEGATIVE

## 2019-01-28 ENCOUNTER — Other Ambulatory Visit: Payer: Self-pay | Admitting: Family Medicine

## 2019-01-28 MED ORDER — FLUCONAZOLE 150 MG PO TABS: 150.0000 mg | ORAL_TABLET | Freq: Once | ORAL | 0 refills | Status: AC

## 2019-01-31 ENCOUNTER — Telehealth: Payer: Self-pay

## 2019-01-31 NOTE — Telephone Encounter (Signed)
Patient was called and informed of lab results. 

## 2019-01-31 NOTE — Telephone Encounter (Signed)
-----   Message from Charlott Rakes, MD sent at 01/28/2019  8:22 AM EST ----- Urine test revealed presence of yeast infection.  I have sent a prescription for Diflucan to the pharmacy.

## 2019-04-15 DIAGNOSIS — Q172 Microtia: Secondary | ICD-10-CM | POA: Diagnosis not present

## 2019-04-25 DIAGNOSIS — N760 Acute vaginitis: Secondary | ICD-10-CM | POA: Diagnosis not present

## 2019-04-26 DIAGNOSIS — N760 Acute vaginitis: Secondary | ICD-10-CM | POA: Diagnosis not present

## 2019-09-11 DIAGNOSIS — L910 Hypertrophic scar: Secondary | ICD-10-CM | POA: Diagnosis not present

## 2019-09-17 DIAGNOSIS — H906 Mixed conductive and sensorineural hearing loss, bilateral: Secondary | ICD-10-CM | POA: Diagnosis not present

## 2019-11-22 ENCOUNTER — Emergency Department (HOSPITAL_COMMUNITY)
Admission: EM | Admit: 2019-11-22 | Discharge: 2019-11-23 | Disposition: A | Discharge status: Home / Self Care | Payer: Medicare Other | Attending: Emergency Medicine | Admitting: Emergency Medicine

## 2019-11-22 ENCOUNTER — Encounter (HOSPITAL_COMMUNITY): Payer: Self-pay

## 2019-11-22 ENCOUNTER — Other Ambulatory Visit: Payer: Self-pay

## 2019-11-22 DIAGNOSIS — R102 Pelvic and perineal pain: Secondary | ICD-10-CM

## 2019-11-22 DIAGNOSIS — R197 Diarrhea, unspecified: Secondary | ICD-10-CM | POA: Diagnosis not present

## 2019-11-22 DIAGNOSIS — R1030 Lower abdominal pain, unspecified: Secondary | ICD-10-CM

## 2019-11-22 DIAGNOSIS — R109 Unspecified abdominal pain: Secondary | ICD-10-CM | POA: Diagnosis present

## 2019-11-22 DIAGNOSIS — Z79899 Other long term (current) drug therapy: Secondary | ICD-10-CM | POA: Insufficient documentation

## 2019-11-22 DIAGNOSIS — D259 Leiomyoma of uterus, unspecified: Secondary | ICD-10-CM | POA: Diagnosis not present

## 2019-11-22 DIAGNOSIS — R1031 Right lower quadrant pain: Secondary | ICD-10-CM | POA: Insufficient documentation

## 2019-11-22 LAB — URINALYSIS, ROUTINE W REFLEX MICROSCOPIC
Bilirubin Urine: NEGATIVE
Glucose, UA: NEGATIVE mg/dL
Ketones, ur: 5 mg/dL — AB
Nitrite: NEGATIVE
Protein, ur: 30 mg/dL — AB
Specific Gravity, Urine: 1.025 (ref 1.005–1.030)
pH: 5 (ref 5.0–8.0)

## 2019-11-22 LAB — COMPREHENSIVE METABOLIC PANEL
ALT: 15 U/L (ref 0–44)
AST: 18 U/L (ref 15–41)
Albumin: 4.6 g/dL (ref 3.5–5.0)
Alkaline Phosphatase: 52 U/L (ref 38–126)
Anion gap: 10 (ref 5–15)
BUN: 15 mg/dL (ref 6–20)
CO2: 23 mmol/L (ref 22–32)
Calcium: 9.5 mg/dL (ref 8.9–10.3)
Chloride: 100 mmol/L (ref 98–111)
Creatinine, Ser: 0.84 mg/dL (ref 0.44–1.00)
GFR calc Af Amer: >60 mL/min (ref >60)
GFR calc non Af Amer: >60 mL/min (ref >60)
Glucose, Bld: 97 mg/dL (ref 70–99)
Potassium: 3.5 mmol/L (ref 3.5–5.1)
Sodium: 133 mmol/L — ABNORMAL LOW (ref 135–145)
Total Bilirubin: 0.9 mg/dL (ref 0.3–1.2)
Total Protein: 9.4 g/dL — ABNORMAL HIGH (ref 6.5–8.1)

## 2019-11-22 LAB — I-STAT BETA HCG BLOOD, ED (MC, WL, AP ONLY): I-stat hCG, quantitative: <5 m[IU]/mL (ref <5)

## 2019-11-22 LAB — CBC
HCT: 43.7 % (ref 36.0–46.0)
Hemoglobin: 14.2 g/dL (ref 12.0–15.0)
MCH: 25.9 pg — ABNORMAL LOW (ref 26.0–34.0)
MCHC: 32.5 g/dL (ref 30.0–36.0)
MCV: 79.6 fL — ABNORMAL LOW (ref 80.0–100.0)
Platelets: 346 10*3/uL (ref 150–400)
RBC: 5.49 MIL/uL — ABNORMAL HIGH (ref 3.87–5.11)
RDW: 13.2 % (ref 11.5–15.5)
WBC: 7.7 10*3/uL (ref 4.0–10.5)
nRBC: 0 % (ref 0.0–0.2)

## 2019-11-22 LAB — LIPASE, BLOOD: Lipase: 49 U/L (ref 11–51)

## 2019-11-22 MED ORDER — NAPROXEN 500 MG PO TABS
500.0000 mg | ORAL_TABLET | Freq: Once | ORAL | Status: AC
  Administered 2019-11-23: 500 mg via ORAL
  Filled 2019-11-22: qty 1

## 2019-11-22 MED ORDER — SODIUM CHLORIDE 0.9% FLUSH: 3.0000 mL | Freq: Once | INTRAVENOUS | Status: DC

## 2019-11-22 NOTE — ED Provider Notes (Signed)
[facility] DEPT Provider Note   CSN: [id number] Arrival date & time: 11/22/19  1444     History Chief Complaint  Patient presents with  . Abdominal Pain    Caitlin Banks is a 28 y.o. female.  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  Pain began on Tuesday.  It has been intermittent, waxing and waning in severity.  Pain does not radiate.  She has had 4 episodes of vomiting since onset of her discomfort.  Denies any emesis in the past 24 hours.  She has continued to have looser bowel movements consistent with diarrhea.  Tried Pepto-Bismol without relief.  Did notice one episode of darker brown/black stool after Pepto-Bismol use.  She has no history of abdominal surgeries.  Is not sexually active.  Denies vaginal bleeding, vaginal discharge, fevers.  LMP 11/06/19.  The history is provided by the patient. No language interpreter was used.  Abdominal Pain      Past Medical History:  Diagnosis Date  . Treacher Collins syndrome     Patient Active Problem List   Diagnosis Date Noted  . Treacher Collins syndrome 03/12/2017    Past Surgical History:  Procedure Laterality Date  . EXTERNAL EAR SURGERY       OB History   No obstetric history on file.     History reviewed. No pertinent family history.  Social History   Tobacco Use  . Smoking status: Never Smoker  . Smokeless tobacco: Never Used  Substance Use Topics  . Alcohol use: No  . Drug use: No    Home Medications Prior to Admission medications   Medication Sig Start Date End Date Taking? Authorizing Provider  acetaminophen (TYLENOL) 500 MG tablet Take 500 mg by mouth every 6 (six) hours as needed for moderate pain.   Yes [provider]  hydroxypropyl methylcellulose / hypromellose (ISOPTO TEARS / GONIOVISC) 2.5 % ophthalmic solution Place 1 drop into both eyes 3 (three) times  daily as needed for dry eyes.   Yes [provider]  mirabegron ER (MYRBETRIQ) 50 MG TB24 tablet Take 50 mg by mouth daily.  08/07/18  Yes [provider]  Multiple Vitamin (MULTIVITAMIN WITH MINERALS) TABS tablet Take 1 tablet by mouth daily.   Yes [provider]  fluticasone (FLONASE) 50 MCG/ACT nasal spray Place 2 sprays into both nostrils daily. Patient not taking: Reported on 11/23/2019 03/12/17   Alfonse Spruce, FNP  loratadine (CLARITIN) 10 MG tablet Take 1 tablet (10 mg total) by mouth daily. Patient not taking: Reported on 11/23/2019 03/12/17   Alfonse Spruce, FNP    Allergies    Patient has no known allergies.  Review of Systems   Review of Systems  Gastrointestinal: Positive for abdominal pain.  Ten systems reviewed and are negative for acute change, except as noted in the HPI.    Physical Exam Updated Vital Signs BP 123/74   Pulse 100   Temp 99.2 F (37.3 C) (Oral)   Resp 15   Ht 5\' 3"  (1.6 m)   Wt 52.7 kg   LMP 11/06/2019   SpO2 100%   BMI 20.58 kg/m   Physical Exam Vitals and nursing note reviewed.  Constitutional:      General: She is not in acute distress.    Appearance: She is well-developed. She is not diaphoretic.     Comments: Patient in NAD. Hard of hearing.  HENT:  Head: Normocephalic and atraumatic.  Eyes:     General: No scleral icterus.    Conjunctiva/sclera: Conjunctivae normal.  Cardiovascular:     Rate and Rhythm: Normal rate and regular rhythm.     Pulses: Normal pulses.  Pulmonary:     Effort: Pulmonary effort is normal. No respiratory distress.  Abdominal:     General: There is no distension.     Palpations: Abdomen is soft.     Comments: Mild suprapubic and RLQ TTP. No guarding, distension, peritoneal signs.  Musculoskeletal:        General: Normal range of motion.     Cervical back: Normal range of motion.  Skin:    General: Skin is warm and dry.     Coloration: Skin is not pale.      Findings: No erythema or rash.  Neurological:     Mental Status: She is alert and oriented to person, place, and time.     Coordination: Coordination normal.  Psychiatric:        Behavior: Behavior normal.     ED Results / Procedures / Treatments   Labs (all labs ordered are listed, but only abnormal results are displayed) Labs Reviewed  COMPREHENSIVE METABOLIC PANEL - Abnormal; Notable for the following components:      Result Value   Sodium 133 (*)    Total Protein 9.4 (*)    All other components within normal limits  CBC - Abnormal; Notable for the following components:   RBC 5.49 (*)    MCV 79.6 (*)    MCH 25.9 (*)    All other components within normal limits  URINALYSIS, ROUTINE W REFLEX MICROSCOPIC - Abnormal; Notable for the following components:   APPearance HAZY (*)    Hgb urine dipstick LARGE (*)    Ketones, ur 5 (*)    Protein, ur 30 (*)    Leukocytes,Ua TRACE (*)    Bacteria, UA RARE (*)    All other components within normal limits  LIPASE, BLOOD  I-STAT BETA HCG BLOOD, ED ([facility], [facility], [facility] ONLY)    EKG None  Radiology US Pelvis Complete  Result Date: 11/23/2019 CLINICAL DATA:  Dysmenorrhea, pelvic pain EXAM: TRANSABDOMINAL ULTRASOUND OF PELVIS DOPPLER ULTRASOUND OF OVARIES TECHNIQUE: Transabdominal ultrasound examination of the pelvis was performed including evaluation of the uterus, ovaries, adnexal regions, and pelvic cul-de-sac. Color and duplex Doppler ultrasound was utilized to evaluate blood flow to the ovaries. COMPARISON:  Pelvic ultrasound 01/02/2012 FINDINGS: Uterus Measurements: 8.2 x 6.2 x 6.1 cm = volume: 164 mL. Uterus is anteflexed. Heterogeneous hypoechoic intramural fibroid measuring 4.1 x 4.3 x 3.7 cm. Seen along the posterior uterine fundus with some shadowing edge artifact. Endometrium Thickness: 3.9 mm, non thickened.  No focal abnormality visualized. Right ovary Measurements: 3.5 x 2.0 x 2.1 cm = volume: 7.7 mL. Normal appearance, no adnexal  mass. Left ovary Measurements: 2.6 x 1.5 x 2.0 cm = volume: 4.3 mL. Normal appearance, no adnexal mass. Pulsed Doppler evaluation demonstrates normal low-resistance arterial and venous waveforms in both ovaries. Other: Trace anechoic free fluid in the pelvis. IMPRESSION: 4.3 cm posterior fundal fibroid. Normal appearance and Doppler evaluation of the ovaries. No evidence of ovarian torsion. Trace anechoic free fluid in the pelvis, a nonspecific finding in a reproductive age female which may be physiologic. Electronically Signed   By: Lovena Le M.D.   On: 11/23/2019 02:13   US ABDOMINAL PELVIC ART/VENT FLOW DOPPLER  Result Date: 11/23/2019 CLINICAL DATA:  Dysmenorrhea, pelvic pain  EXAM: TRANSABDOMINAL ULTRASOUND OF PELVIS DOPPLER ULTRASOUND OF OVARIES TECHNIQUE: Transabdominal ultrasound examination of the pelvis was performed including evaluation of the uterus, ovaries, adnexal regions, and pelvic cul-de-sac. Color and duplex Doppler ultrasound was utilized to evaluate blood flow to the ovaries. COMPARISON:  Pelvic ultrasound 01/02/2012 FINDINGS: Uterus Measurements: 8.2 x 6.2 x 6.1 cm = volume: 164 mL. Uterus is anteflexed. Heterogeneous hypoechoic intramural fibroid measuring 4.1 x 4.3 x 3.7 cm. Seen along the posterior uterine fundus with some shadowing edge artifact. Endometrium Thickness: 3.9 mm, non thickened.  No focal abnormality visualized. Right ovary Measurements: 3.5 x 2.0 x 2.1 cm = volume: 7.7 mL. Normal appearance, no adnexal mass. Left ovary Measurements: 2.6 x 1.5 x 2.0 cm = volume: 4.3 mL. Normal appearance, no adnexal mass. Pulsed Doppler evaluation demonstrates normal low-resistance arterial and venous waveforms in both ovaries. Other: Trace anechoic free fluid in the pelvis. IMPRESSION: 4.3 cm posterior fundal fibroid. Normal appearance and Doppler evaluation of the ovaries. No evidence of ovarian torsion. Trace anechoic free fluid in the pelvis, a nonspecific finding in a reproductive  age female which may be physiologic. Electronically Signed   By: Lovena Le M.D.   On: 11/23/2019 02:13    Procedures Procedures (including critical care time)  Medications Ordered in ED Medications  sodium chloride flush (NS) 0.9 % injection 3 mL (has no administration in time range)  naproxen (NAPROSYN) tablet 500 mg (500 mg Oral Given 11/23/19 0130)    ED Course  I have reviewed the triage vital signs and the nursing notes.  Pertinent labs & imaging results that were available during my care of the patient were reviewed by me and considered in my medical decision making (see chart for details).     MDM Rules/Calculators/A&P                       12:00 AM 28 y/o female presenting for lower abdominal pain x 5 days with associated nonbloody diarrhea. Pain comes and goes without known modifying factors. Also endorsing emesis which is sporadic; none x 24 hours.  Abdominal exam with minimal tenderness in the suprapubic abdomen and right lower quadrant.  There is no distention, guarding.  No peritoneal signs.  No concern for STD as patient states that she has never been sexually active.  Will obtain pelvic ultrasound to assess for ovarian cyst as well as intermittent torsion.  Symptoms may also be due to gastroenteritis.  Will give Naproxen for pain while in the ED.  3:25 AM Pelvic ultrasound shows 4.3 cm posterior uterine fibroid.  It is possible this may be contributing to the patient's abdominal discomfort.  Her ovaries appear normal with good flow.  No concern for torsion.  Repeat abdominal exam is stable, fairly benign.  Despite fibroid, question viral etiology given lower abdominal discomfort with diarrhea.  As her symptoms have been present for 5 days without laboratory changes, my suspicion for emergent abdominal process is low.  She has no leukocytosis or fever to suggest infectious etiology.  Chronicity of symptoms makes appendicitis unlikely.  No electrolyte derangements.  Liver  and kidney function preserved.  No evidence of UTI.  Plan for discharge with supportive care instruction.  I have encouraged the patient to follow-up with her primary care doctor this week for recheck.  Return precautions discussed and provided. Patient discharged in stable condition with no unaddressed concerns.   Final Clinical Impression(s) / ED Diagnoses Final diagnoses:  Pelvic pain in female  Lower abdominal pain  Diarrhea, unspecified type  Uterine leiomyoma, unspecified location    Rx / DC Orders ED Discharge Orders    None       Antonietta Breach, PA-C [id number] [phone]    Delora Fuel, MD [id number] [phone]

## 2019-11-22 NOTE — ED Triage Notes (Signed)
Pt states lower abd pain since Tuesday. Pt states she has had emesis and diarrhea.

## 2019-11-23 ENCOUNTER — Emergency Department (HOSPITAL_COMMUNITY): Payer: Medicare Other

## 2019-11-23 DIAGNOSIS — R1031 Right lower quadrant pain: Secondary | ICD-10-CM | POA: Diagnosis not present

## 2019-11-23 MED ORDER — LACTINEX PO CHEW: 1.0000 | CHEWABLE_TABLET | Freq: Three times a day (TID) | ORAL | 0 refills | Status: DC

## 2019-11-23 MED ORDER — DICYCLOMINE HCL 20 MG PO TABS: 20.0000 mg | ORAL_TABLET | Freq: Two times a day (BID) | ORAL | 0 refills | Status: DC | PRN

## 2019-11-23 MED ORDER — NAPROXEN 500 MG PO TABS: 500.0000 mg | ORAL_TABLET | Freq: Two times a day (BID) | ORAL | 0 refills | Status: DC

## 2019-11-23 NOTE — Discharge Instructions (Signed)
Your work-up in the emergency department today has been reassuring.  Your ultrasound did show evidence of a uterine fibroid.  It is unclear if this may be contributing to your abdominal discomfort, but can be followed by a primary care doctor or OB/GYN.  Uterine fibroids can cause irregular periods or heavy periods.  Discomfort from fibroids is usually well managed with anti-inflammatories such as ibuprofen or Aleve.  You have been given prescriptions for Lactinex to take for diarrhea management.  Use Bentyl as needed for ongoing abdominal cramping.  Drink plenty of fluids to prevent dehydration.  We recommend follow-up with your primary care doctor within the next week.  Return if symptoms persist or worsen.

## 2019-11-23 NOTE — ED Notes (Signed)
U/s at bedside

## 2019-12-10 ENCOUNTER — Encounter: Payer: Self-pay | Admitting: Family Medicine

## 2019-12-10 ENCOUNTER — Other Ambulatory Visit: Payer: Self-pay

## 2019-12-10 ENCOUNTER — Ambulatory Visit: Payer: Medicare Other | Attending: Family Medicine | Admitting: Family Medicine

## 2019-12-10 VITALS — BP 115/69 | HR 87 | Ht 63.0 in | Wt 121.0 lb

## 2019-12-10 DIAGNOSIS — Q754 Mandibulofacial dysostosis: Secondary | ICD-10-CM

## 2019-12-10 DIAGNOSIS — Z23 Encounter for immunization: Secondary | ICD-10-CM

## 2019-12-10 DIAGNOSIS — N946 Dysmenorrhea, unspecified: Secondary | ICD-10-CM | POA: Diagnosis not present

## 2019-12-10 DIAGNOSIS — Z0000 Encounter for general adult medical examination without abnormal findings: Secondary | ICD-10-CM

## 2019-12-10 DIAGNOSIS — Z1159 Encounter for screening for other viral diseases: Secondary | ICD-10-CM | POA: Diagnosis not present

## 2019-12-10 DIAGNOSIS — K5909 Other constipation: Secondary | ICD-10-CM | POA: Diagnosis not present

## 2019-12-10 MED ORDER — IBUPROFEN 600 MG PO TABS: 600.0000 mg | ORAL_TABLET | Freq: Two times a day (BID) | ORAL | 1 refills | Status: DC | PRN

## 2019-12-10 MED ORDER — POLYETHYLENE GLYCOL 3350 17 GM/SCOOP PO POWD: 17.0000 g | Freq: Every day | ORAL | 1 refills | Status: DC

## 2019-12-10 NOTE — Progress Notes (Signed)
Subjective:  Patient ID: Caitlin Banks, female    DOB: 01-13-1991  Age: 29 y.o. MRN: [id number]  CC:  Chief Complaint  Patient presents with  . 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  Seen at the ED on 11/22/19 for abdominal pain and diarrhea. Labs were unremarkable and pelvic ultrasound reveal fibroid. Diagnosed as viral gastroenteritis and discharged. She states abdominal pain is intermittent in her lower abdomen and also endorses presence of constipation. Declines PAP smear. She has never been sexually active. Also requests medication for dysmenorrhea.  Past Medical History:  Diagnosis Date  . Treacher Collins syndrome     Past Surgical History:  Procedure Laterality Date  . EXTERNAL EAR SURGERY      No family history on file.  No Known Allergies  Outpatient Medications Prior to Visit  Medication Sig Dispense Refill  . acetaminophen (TYLENOL) 500 MG tablet Take 500 mg by mouth every 6 (six) hours as needed for moderate pain.    Marland Kitchen dicyclomine (BENTYL) 20 MG tablet Take 1 tablet (20 mg total) by mouth every 12 (twelve) hours as needed (for abdominal pain/cramping). 20 tablet 0  . hydroxypropyl methylcellulose / hypromellose (ISOPTO TEARS / GONIOVISC) 2.5 % ophthalmic solution Place 1 drop into both eyes 3 (three) times daily as needed for dry eyes.    Marland Kitchen lactobacillus acidophilus & bulgar (LACTINEX) chewable tablet Chew 1 tablet by mouth 3 (three) times daily with meals. 30 tablet 0  . mirabegron ER (MYRBETRIQ) 50 MG TB24 tablet Take 50 mg by mouth daily.     . Multiple Vitamin (MULTIVITAMIN WITH MINERALS) TABS tablet Take 1 tablet by mouth daily.    . naproxen (NAPROSYN) 500 MG tablet Take 1 tablet (500 mg total) by mouth 2 (two) times daily. 30 tablet 0  . fluticasone (FLONASE) 50 MCG/ACT nasal spray Place 2  sprays into both nostrils daily. (Patient not taking: Reported on 11/23/2019) 16 g 2  . loratadine (CLARITIN) 10 MG tablet Take 1 tablet (10 mg total) by mouth daily. (Patient not taking: Reported on 11/23/2019) 30 tablet 2   No facility-administered medications prior to visit.     ROS Review of Systems  Constitutional: Negative for activity change, appetite change and fatigue.  HENT: Negative for congestion, sinus pressure and sore throat.   Eyes: Negative for visual disturbance.  Respiratory: Negative for cough, chest tightness, shortness of breath and wheezing.   Cardiovascular: Negative for chest pain and palpitations.  Gastrointestinal: Positive for abdominal pain and constipation. Negative for abdominal distention.  Endocrine: Negative for polydipsia.  Genitourinary: Negative for dysuria and frequency.  Musculoskeletal: Negative for arthralgias and back pain.  Skin: Negative for rash.  Neurological: Negative for tremors, light-headedness and numbness.  Hematological: Does not bruise/bleed easily.  Psychiatric/Behavioral: Negative for agitation and behavioral problems.    Objective:  BP 115/69   Pulse 87   Ht 5\' 3"  (1.6 m)   Wt 121 lb (54.9 kg)   SpO2 100%   BMI 21.43 kg/m   BP/Weight 12/10/2019 11/23/2019 [id number]  Systolic BP [id number] -  Diastolic BP 69 74 -  Wt. (Lbs) 121 - 116.2  BMI 21.43 - 20.58      Physical Exam Constitutional:      Appearance: She is well-developed.  Neck:     Vascular: No JVD.  Cardiovascular:  Rate and Rhythm: Normal rate.     Heart sounds: Normal heart sounds. No murmur.  Pulmonary:     Effort: Pulmonary effort is normal.     Breath sounds: Normal breath sounds. No wheezing or rales.  Chest:     Chest wall: No tenderness.  Abdominal:     General: Bowel sounds are normal. There is no distension.     Palpations: Abdomen is soft. There is no mass.     Tenderness: There is no abdominal tenderness.  Musculoskeletal:         General: Normal range of motion.     Right lower leg: No edema.     Left lower leg: No edema.  Neurological:     Mental Status: She is alert and oriented to person, place, and time.  Psychiatric:        Mood and Affect: Mood normal.     CMP Latest Ref Rng & Units 11/22/2019 07/02/2018  Glucose 70 - 99 mg/dL 97 60(L)  BUN 6 - 20 mg/dL 15 17  Creatinine 0.44 - 1.00 mg/dL 0.84 0.97  Sodium 135 - 145 mmol/L 133(L) 141  Potassium 3.5 - 5.1 mmol/L 3.5 4.1  Chloride 98 - 111 mmol/L 100 102  CO2 22 - 32 mmol/L 23 22  Calcium 8.9 - 10.3 mg/dL 9.5 9.8  Total Protein 6.5 - 8.1 g/dL 9.4(H) 7.4  Total Bilirubin 0.3 - 1.2 mg/dL 0.9 0.3  Alkaline Phos 38 - 126 U/L 52 43  AST 15 - 41 U/L 18 19  ALT 0 - 44 U/L 15 19    Lipid Panel  No results found for: CHOL, TRIG, HDL, CHOLHDL, VLDL, LDLCALC, LDLDIRECT  CBC    Component Value Date/Time   WBC 7.7 11/22/2019 1806   RBC 5.49 (H) 11/22/2019 1806   HGB 14.2 11/22/2019 1806   HGB 13.1 07/02/2018 1429   HCT 43.7 11/22/2019 1806   HCT 40.8 07/02/2018 1429   PLT 346 11/22/2019 1806   PLT 201 07/02/2018 1429   MCV 79.6 (L) 11/22/2019 1806   MCV 84 07/02/2018 1429   MCH 25.9 (L) 11/22/2019 1806   MCHC 32.5 11/22/2019 1806   RDW 13.2 11/22/2019 1806   RDW 13.8 07/02/2018 1429   LYMPHSABS 2.0 07/02/2018 1429   EOSABS 0.1 07/02/2018 1429   BASOSABS 0.0 07/02/2018 1429    Lab Results  Component Value Date   HGBA1C 5.0 11/30/2017    Assessment & Plan:   1. Treacher Collins syndrome Stable Followed by ENT at [facility]  2. Other constipation Uncontrolled Counseled on increasing fiber intake - polyethylene glycol powder (GLYCOLAX/MIRALAX) 17 GM/SCOOP powder; Take 17 g by mouth daily.  Dispense: 3350 g; Refill: 1  3. Dysmenorrhea Presence of uterine fibroid also contributory - ibuprofen (ADVIL) 600 MG tablet; Take 1 tablet (600 mg total) by mouth every 12 (twelve) hours as needed for cramping.  Dispense: 60 tablet; Refill: 1  4.  Screening for viral disease - HIV antibody (with reflex)  5. Healthcare maintenance - flu shot    Return in about 1 year (around 12/09/2020) for coordination of care.    Charlott Rakes, MD, FAAFP. [facility] and [place] [place], [place]   12/10/2019, 3:27 PM

## 2019-12-10 NOTE — Patient Instructions (Signed)
Dysmenorrhea Dysmenorrhea means painful cramps during your period (menstrual period). You will have pain in your lower belly (abdomen). The pain is caused by the tightening (contracting) of the muscles of the womb (uterus). The pain may be mild or very bad. With this condition, you may:  Have a headache.  Feel sick to your stomach (nauseous).  Throw up (vomit).  Have lower back pain. Follow these instructions at home: Helping pain and cramping   Put heat on your lower back or belly when you have pain or cramps. Use the heat source that your doctor tells you to use. ? Place a towel between your skin and the heat. ? Leave the heat on for 20-30 minutes. ? Remove the heat if your skin turns bright red. This is especially important if you cannot feel pain, heat, or cold. ? Do not have a heating pad on during sleep.  Do aerobic exercises. These include walking, swimming, or biking. These may help with cramps.  Massage your lower back or belly. This may help lessen pain. General instructions  Take over-the-counter and prescription medicines only as told by your doctor.  Do not drive or use heavy machinery while taking prescription pain medicine.  Avoid alcohol and caffeine during and right before your period. These can make cramps worse.  Do not use any products that have nicotine or tobacco. These include cigarettes and e-cigarettes. If you need help quitting, ask your doctor.  Keep all follow-up visits as told by your doctor. This is important. Contact a doctor if:  You have pain that gets worse.  You have pain that does not get better with medicine.  You have pain during sex.  You feel sick to your stomach or you throw up during your period, and medicine does not help. Get help right away if:  You pass out (faint). Summary  Dysmenorrhea means painful cramps during your period (menstrual period).  Put heat on your lower back or belly when you have pain or cramps.  Do  exercises like walking, swimming, or biking to help with cramps.  Contact a doctor if you have pain during sex. This information is not intended to replace advice given to you by your health care provider. Make sure you discuss any questions you have with your health care provider. Document Revised: 10/26/2017 Document Reviewed: 11/30/2016 Elsevier Patient Education  2020 Elsevier Inc.  

## 2019-12-11 ENCOUNTER — Encounter: Payer: Self-pay | Admitting: Family Medicine

## 2019-12-11 ENCOUNTER — Telehealth (INDEPENDENT_AMBULATORY_CARE_PROVIDER_SITE_OTHER): Payer: Self-pay

## 2019-12-11 LAB — HIV ANTIBODY (ROUTINE TESTING W REFLEX): HIV Screen 4th Generation wRfx: NONREACTIVE

## 2019-12-11 NOTE — Telephone Encounter (Signed)
Patient name and DOB has been verified Patient was informed of lab results. Patient had no questions.  

## 2019-12-11 NOTE — Telephone Encounter (Signed)
-----   Message from Charlott Rakes, MD sent at 12/11/2019 12:13 PM EST ----- Please inform the patient that labs are normal. Thank you.

## 2020-02-09 ENCOUNTER — Other Ambulatory Visit: Payer: Self-pay | Admitting: Family Medicine

## 2020-02-09 DIAGNOSIS — N946 Dysmenorrhea, unspecified: Secondary | ICD-10-CM

## 2020-02-20 DIAGNOSIS — L905 Scar conditions and fibrosis of skin: Secondary | ICD-10-CM | POA: Diagnosis not present

## 2020-02-20 DIAGNOSIS — L910 Hypertrophic scar: Secondary | ICD-10-CM | POA: Diagnosis not present

## 2020-02-20 HISTORY — PX: KELOID EXCISION: SHX1856

## 2020-03-11 ENCOUNTER — Ambulatory Visit: Payer: Medicare Other | Attending: Family

## 2020-03-11 DIAGNOSIS — Z23 Encounter for immunization: Secondary | ICD-10-CM

## 2020-03-11 NOTE — Progress Notes (Signed)
   Covid-19 Vaccination Clinic  Name:  Caitlin Banks    MRN: [id number] DOB: February 03, 1991  03/11/2020  [name] was observed post Covid-19 immunization for 15 minutes without incident. She was provided with Vaccine Information Sheet and instruction to access the V-Safe system.   [name] was instructed to call 911 with any severe reactions post vaccine: Marland Kitchen Difficulty breathing  . Swelling of face and throat  . A fast heartbeat  . A bad rash all over body  . Dizziness and weakness   Immunizations Administered    Name Date Dose VIS Date Route   Moderna COVID-19 Vaccine 03/11/2020  3:42 PM 0.5 mL 10/28/2019 Intramuscular   Manufacturer: Moderna   Lot: [id number]   [id number]

## 2020-04-06 DIAGNOSIS — Z09 Encounter for follow-up examination after completed treatment for conditions other than malignant neoplasm: Secondary | ICD-10-CM | POA: Diagnosis not present

## 2020-04-06 DIAGNOSIS — Z872 Personal history of diseases of the skin and subcutaneous tissue: Secondary | ICD-10-CM | POA: Diagnosis not present

## 2020-04-13 ENCOUNTER — Ambulatory Visit: Payer: Medicare Other | Attending: Family

## 2020-04-13 ENCOUNTER — Ambulatory Visit: Payer: Medicare Other

## 2020-04-13 DIAGNOSIS — Z23 Encounter for immunization: Secondary | ICD-10-CM

## 2020-04-13 NOTE — Progress Notes (Signed)
   Covid-19 Vaccination Clinic  Name:  MAKILA MALER    MRN: [id number] DOB: 1991/02/20  04/13/2020  Ms. Lesiak was observed post Covid-19 immunization for 15 minutes without incident. She was provided with Vaccine Information Sheet and instruction to access the V-Safe system.   Ms. Merlini was instructed to call 911 with any severe reactions post vaccine: Marland Kitchen Difficulty breathing  . Swelling of face and throat  . A fast heartbeat  . A bad rash all over body  . Dizziness and weakness   Immunizations Administered    Name Date Dose VIS Date Route   Moderna COVID-19 Vaccine 04/13/2020  2:06 PM 0.5 mL 10/2019 Intramuscular   Manufacturer: Moderna   Lot: [id number]   [id number]

## 2020-05-29 ENCOUNTER — Other Ambulatory Visit: Payer: Self-pay | Admitting: Family Medicine

## 2020-05-29 DIAGNOSIS — N946 Dysmenorrhea, unspecified: Secondary | ICD-10-CM

## 2020-05-29 NOTE — Telephone Encounter (Signed)
Requested Prescriptions  Pending Prescriptions Disp Refills   ibuprofen (ADVIL) 600 MG tablet [Pharmacy Med Name: IBUPROFEN 600MG  TABLETS] 60 tablet 1    Sig: TAKE 1 TABLET(600 MG) BY MOUTH EVERY 12 HOURS AS NEEDED FOR CRAMPING     Analgesics:  NSAIDS Passed - 05/29/2020  7:09 AM      Passed - Cr in normal range and within 360 days    Creatinine, Ser  Date Value Ref Range Status  11/22/2019 0.84 0.44 - 1.00 mg/dL Final         Passed - HGB in normal range and within 360 days    Hemoglobin  Date Value Ref Range Status  11/22/2019 14.2 12.0 - 15.0 g/dL Final  07/02/2018 13.1 11.1 - 15.9 g/dL Final         Passed - Patient is not pregnant      Passed - Valid encounter within last 12 months    Recent Outpatient Visits          5 months ago [place], Charlane Ferretti, MD   1 year ago Vaginal discharge   [place], [place]   1 year ago Vaginal discharge   [place], [place]   1 year ago Cellulitis, unspecified cellulitis site   [place], [place], [place]   2 years ago Treacher Theda Sers syndrome   [facility] Charlott Rakes, MD

## 2020-09-03 ENCOUNTER — Telehealth: Payer: Self-pay | Admitting: *Deleted

## 2020-09-03 NOTE — Telephone Encounter (Signed)
Scheduled an appt to return to office in November to fill out paperwork.

## 2020-09-03 NOTE — Telephone Encounter (Signed)
Copied from [place] [id number]. Topic: General - Other >> Sep 03, 2020  9:59 AM Leward Quan A wrote: Reason for CRM: Patient called to ask Dr Margarita Rana to please contact her stated that it is very very important. Tried making an appointment but 10/19/20 was first available she need to see and speak to Dr Margarita Rana before that say she have a letter to show her also. Please advise  Ph# [phone] or [phone]

## 2020-09-26 ENCOUNTER — Other Ambulatory Visit: Payer: Self-pay | Admitting: Family Medicine

## 2020-09-26 DIAGNOSIS — N946 Dysmenorrhea, unspecified: Secondary | ICD-10-CM

## 2020-09-26 NOTE — Telephone Encounter (Signed)
Requested Prescriptions  Pending Prescriptions Disp Refills   ibuprofen (ADVIL) 600 MG tablet [Pharmacy Med Name: IBUPROFEN 600MG  TABLETS] 60 tablet 1    Sig: TAKE 1 TABLET(600 MG) BY MOUTH EVERY 12 HOURS AS NEEDED FOR CRAMPING     Analgesics:  NSAIDS Passed - 09/26/2020  7:01 AM      Passed - Cr in normal range and within 360 days    Creatinine, Ser  Date Value Ref Range Status  11/22/2019 0.84 0.44 - 1.00 mg/dL Final         Passed - HGB in normal range and within 360 days    Hemoglobin  Date Value Ref Range Status  11/22/2019 14.2 12.0 - 15.0 g/dL Final  07/02/2018 13.1 11.1 - 15.9 g/dL Final         Passed - Patient is not pregnant      Passed - Valid encounter within last 12 months    Recent Outpatient Visits          9 months ago [facility] [place], Charlane Ferretti, MD   1 year ago Vaginal discharge   Sims, MD   1 year ago Vaginal discharge   [facility], [place], [place]   2 years ago Cellulitis, unspecified cellulitis site   [place], Charlane Ferretti, MD   2 years ago Crouch, Enobong, MD      Future Appointments            In 3 days Charlott Rakes, MD [place]

## 2020-09-29 ENCOUNTER — Encounter: Payer: Self-pay | Admitting: Family Medicine

## 2020-09-29 ENCOUNTER — Other Ambulatory Visit: Payer: Self-pay

## 2020-09-29 ENCOUNTER — Ambulatory Visit: Payer: Medicare Other | Attending: Family Medicine | Admitting: Family Medicine

## 2020-09-29 VITALS — BP 124/79 | HR 88 | Ht 63.0 in | Wt 123.2 lb

## 2020-09-29 DIAGNOSIS — Z029 Encounter for administrative examinations, unspecified: Secondary | ICD-10-CM | POA: Diagnosis not present

## 2020-09-29 DIAGNOSIS — Q754 Mandibulofacial dysostosis: Secondary | ICD-10-CM | POA: Diagnosis not present

## 2020-09-29 NOTE — Progress Notes (Signed)
Subjective:  Patient ID: Caitlin Banks, female    DOB: 1991/10/04  Age: 29 y.o. 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  She has completed her schooling and is currently unable to work due to her disability and stays home. Accompanied by her father to today's visit.  Abdominal pain is listed on the chief complaint but she denies complaints today.  Past Medical History:  Diagnosis Date  . Treacher Collins syndrome     Past Surgical History:  Procedure Laterality Date  . EXTERNAL EAR SURGERY      History reviewed. No pertinent family history.  No Known Allergies  Outpatient Medications Prior to Visit  Medication Sig Dispense Refill  . acetaminophen (TYLENOL) 500 MG tablet Take 500 mg by mouth every 6 (six) hours as needed for moderate pain.    Marland Kitchen dicyclomine (BENTYL) 20 MG tablet Take 1 tablet (20 mg total) by mouth every 12 (twelve) hours as needed (for abdominal pain/cramping). 20 tablet 0  . hydroxypropyl methylcellulose / hypromellose (ISOPTO TEARS / GONIOVISC) 2.5 % ophthalmic solution Place 1 drop into both eyes 3 (three) times daily as needed for dry eyes.    Marland Kitchen ibuprofen (ADVIL) 600 MG tablet TAKE 1 TABLET(600 MG) BY MOUTH EVERY 12 HOURS AS NEEDED FOR CRAMPING 60 tablet 1  . lactobacillus acidophilus & bulgar (LACTINEX) chewable tablet Chew 1 tablet by mouth 3 (three) times daily with meals. 30 tablet 0  . mirabegron ER (MYRBETRIQ) 50 MG TB24 tablet Take 50 mg by mouth daily.     . Multiple Vitamin (MULTIVITAMIN WITH MINERALS) TABS tablet Take 1 tablet by mouth daily.    . naproxen (NAPROSYN) 500 MG tablet Take 1 tablet (500 mg total) by mouth 2 (two) times daily. 30 tablet 0  . polyethylene  glycol powder (GLYCOLAX/MIRALAX) 17 GM/SCOOP powder Take 17 g by mouth daily. 3350 g 1  . fluticasone (FLONASE) 50 MCG/ACT nasal spray Place 2 sprays into both nostrils daily. (Patient not taking: Reported on 11/23/2019) 16 g 2  . loratadine (CLARITIN) 10 MG tablet Take 1 tablet (10 mg total) by mouth daily. (Patient not taking: Reported on 11/23/2019) 30 tablet 2   No facility-administered medications prior to visit.     ROS Review of Systems  Constitutional: Negative for activity change, appetite change and fatigue.  HENT: Negative for congestion, sinus pressure and sore throat.   Eyes: Negative for visual disturbance.  Respiratory: Negative for cough, chest tightness, shortness of breath and wheezing.   Cardiovascular: Negative for chest pain and palpitations.  Gastrointestinal: Negative for abdominal distention, abdominal pain and constipation.  Endocrine: Negative for polydipsia.  Genitourinary: Negative for dysuria and frequency.  Musculoskeletal: Negative for arthralgias and back pain.  Skin: Negative for rash.  Neurological: Negative for tremors, light-headedness and numbness.  Hematological: Does not bruise/bleed easily.  Psychiatric/Behavioral: Negative for agitation and behavioral problems.    Objective:  BP 124/79   Pulse 88   Ht 5\' 3"  (1.6 m)   Wt 123 lb 3.2 oz (55.9 kg)   SpO2 100%   BMI 21.82 kg/m   BP/Weight 09/29/2020 12/10/2019 79/12/4095  Systolic BP 353 299 242  Diastolic BP 79 69 74  Wt. (Lbs) 123.2 121 -  BMI 21.82  21.43 -      Physical Exam Constitutional:      Appearance: She is well-developed.  Neck:     Vascular: No JVD.  Cardiovascular:     Rate and Rhythm: Normal rate.     Heart sounds: Normal heart sounds. No murmur heard.   Pulmonary:     Effort: Pulmonary effort is normal.     Breath sounds: Normal breath sounds. No wheezing or rales.  Chest:     Chest wall: No tenderness.  Abdominal:     General: Bowel sounds are normal. There is  no distension.     Palpations: Abdomen is soft. There is no mass.     Tenderness: There is no abdominal tenderness.  Musculoskeletal:        General: Normal range of motion.     Right lower leg: No edema.     Left lower leg: No edema.  Neurological:     Mental Status: She is alert and oriented to person, place, and time.  Psychiatric:        Mood and Affect: Mood normal.     CMP Latest Ref Rng & Units 11/22/2019 07/02/2018  Glucose 70 - 99 mg/dL 97 60(L)  BUN 6 - 20 mg/dL 15 17  Creatinine 0.44 - 1.00 mg/dL 0.84 0.97  Sodium 135 - 145 mmol/L 133(L) 141  Potassium 3.5 - 5.1 mmol/L 3.5 4.1  Chloride 98 - 111 mmol/L 100 102  CO2 22 - 32 mmol/L 23 22  Calcium 8.9 - 10.3 mg/dL 9.5 9.8  Total Protein 6.5 - 8.1 g/dL 9.4(H) 7.4  Total Bilirubin 0.3 - 1.2 mg/dL 0.9 0.3  Alkaline Phos 38 - 126 U/L 52 43  AST 15 - 41 U/L 18 19  ALT 0 - 44 U/L 15 19    Lipid Panel  No results found for: CHOL, TRIG, HDL, CHOLHDL, VLDL, LDLCALC, LDLDIRECT  CBC    Component Value Date/Time   WBC 7.7 11/22/2019 1806   RBC 5.49 (H) 11/22/2019 1806   HGB 14.2 11/22/2019 1806   HGB 13.1 07/02/2018 1429   HCT 43.7 11/22/2019 1806   HCT 40.8 07/02/2018 1429   PLT 346 11/22/2019 1806   PLT 201 07/02/2018 1429   MCV 79.6 (L) 11/22/2019 1806   MCV 84 07/02/2018 1429   MCH 25.9 (L) 11/22/2019 1806   MCHC 32.5 11/22/2019 1806   RDW 13.2 11/22/2019 1806   RDW 13.8 07/02/2018 1429   LYMPHSABS 2.0 07/02/2018 1429   EOSABS 0.1 07/02/2018 1429   BASOSABS 0.0 07/02/2018 1429    Lab Results  Component Value Date   HGBA1C 5.0 11/30/2017    Assessment & Plan:  1. Treacher Collins syndrome Status post multiple surgeries Conductive hearing loss; uses hearing aids Disabled due to this  2. Encounters for administrative purposes Paperwork completed for tuition reimbursement    No orders of the defined types were placed in this encounter.   Follow-up: Return in about 1 month (around 10/29/2020) for  chronic disease management.       Charlott Rakes, MD, FAAFP. [facility] and [place], [place]   09/29/2020, 3:43 PM

## 2020-09-29 NOTE — Progress Notes (Signed)
Needs disability form filled out  Having pain in abdomen.

## 2020-11-08 ENCOUNTER — Other Ambulatory Visit: Payer: Self-pay

## 2020-11-08 ENCOUNTER — Encounter: Payer: Self-pay | Admitting: Family Medicine

## 2020-11-08 ENCOUNTER — Other Ambulatory Visit (HOSPITAL_COMMUNITY)
Admission: RE | Admit: 2020-11-08 | Discharge: 2020-11-08 | Disposition: A | Discharge status: Home / Self Care | Payer: Medicare Other | Source: Ambulatory Visit | Attending: Family Medicine | Admitting: Family Medicine

## 2020-11-08 ENCOUNTER — Ambulatory Visit: Payer: Medicare Other | Attending: Family Medicine | Admitting: Family Medicine

## 2020-11-08 VITALS — BP 112/71 | HR 90 | Ht 63.0 in | Wt 124.2 lb

## 2020-11-08 DIAGNOSIS — N898 Other specified noninflammatory disorders of vagina: Secondary | ICD-10-CM | POA: Diagnosis present

## 2020-11-08 DIAGNOSIS — R140 Abdominal distension (gaseous): Secondary | ICD-10-CM | POA: Diagnosis not present

## 2020-11-08 MED ORDER — SIMETHICONE 80 MG PO CHEW: 80.0000 mg | CHEWABLE_TABLET | Freq: Three times a day (TID) | ORAL | 1 refills | Status: DC | PRN

## 2020-11-08 NOTE — Progress Notes (Signed)
Having brown vaginal discharge.  Having abdominal pain, states that she feel something in her stomach.

## 2020-11-08 NOTE — Progress Notes (Signed)
Subjective:  Patient ID: Caitlin Banks, female    DOB: 1991/02/24  Age: 29 y.o. 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 Complains of gas in her stomach and states she has to take medication every night prior to going to bed.  Denies excessive flatulence, nausea, vomiting, abdominal pain, hematochezia.  Denies reflux symptoms or dyspepsia. Also complains of vaginal discharge which has been intermittent for the last couple of days.  She has no urinary symptoms.  Past Medical History:  Diagnosis Date  . Treacher Collins syndrome     Past Surgical History:  Procedure Laterality Date  . EXTERNAL EAR SURGERY      No family history on file.  No Known Allergies  Outpatient Medications Prior to Visit  Medication Sig Dispense Refill  . acetaminophen (TYLENOL) 500 MG tablet Take 500 mg by mouth every 6 (six) hours as needed for moderate pain.    Marland Kitchen dicyclomine (BENTYL) 20 MG tablet Take 1 tablet (20 mg total) by mouth every 12 (twelve) hours as needed (for abdominal pain/cramping). 20 tablet 0  . hydroxypropyl methylcellulose / hypromellose (ISOPTO TEARS / GONIOVISC) 2.5 % ophthalmic solution Place 1 drop into both eyes 3 (three) times daily as needed for dry eyes.    Marland Kitchen ibuprofen (ADVIL) 600 MG tablet TAKE 1 TABLET(600 MG) BY MOUTH EVERY 12 HOURS AS NEEDED FOR CRAMPING 60 tablet 1  . lactobacillus acidophilus & bulgar (LACTINEX) chewable tablet Chew 1 tablet by mouth 3 (three) times daily with meals. 30 tablet 0  . mirabegron ER (MYRBETRIQ) 50 MG TB24 tablet Take 50 mg by mouth daily.     . Multiple Vitamin (MULTIVITAMIN WITH MINERALS) TABS tablet Take 1 tablet by mouth daily.    . naproxen (NAPROSYN) 500 MG tablet Take 1 tablet (500 mg total) by mouth 2 (two) times  daily. 30 tablet 0  . polyethylene glycol powder (GLYCOLAX/MIRALAX) 17 GM/SCOOP powder Take 17 g by mouth daily. 3350 g 1  . fluticasone (FLONASE) 50 MCG/ACT nasal spray Place 2 sprays into both nostrils daily. (Patient not taking: No sig reported) 16 g 2  . loratadine (CLARITIN) 10 MG tablet Take 1 tablet (10 mg total) by mouth daily. (Patient not taking: No sig reported) 30 tablet 2   No facility-administered medications prior to visit.     ROS Review of Systems  Constitutional: Negative for activity change, appetite change and fatigue.  HENT: Negative for congestion, sinus pressure and sore throat.   Eyes: Negative for visual disturbance.  Respiratory: Negative for cough, chest tightness, shortness of breath and wheezing.   Cardiovascular: Negative for chest pain and palpitations.  Gastrointestinal: Negative for abdominal distention, abdominal pain and constipation.  Endocrine: Negative for polydipsia.  Genitourinary: Positive for vaginal discharge. Negative for dysuria and frequency.  Musculoskeletal: Negative for arthralgias and back pain.  Skin: Negative for rash.  Neurological: Negative for tremors, light-headedness and numbness.  Hematological: Does not bruise/bleed easily.  Psychiatric/Behavioral: Negative for agitation and behavioral problems.    Objective:  BP 112/71   Pulse 90   Ht 5\' 3"  (1.6 m)   Wt 124 lb 3.2 oz (56.3 kg)   SpO2 100%   BMI 22.00 kg/m   BP/Weight 11/08/2020 09/29/2020 0/92/3300  Systolic BP 762 263 335  Diastolic BP  71 79 69  Wt. (Lbs) 124.2 123.2 121  BMI 22 21.82 21.43      Physical Exam Constitutional:      Appearance: She is well-developed.  Neck:     Vascular: No JVD.  Cardiovascular:     Rate and Rhythm: Normal rate.     Heart sounds: Normal heart sounds. No murmur heard.   Pulmonary:     Effort: Pulmonary effort is normal.     Breath sounds: Normal breath sounds. No wheezing or rales.  Chest:     Chest wall: No tenderness.   Abdominal:     General: Bowel sounds are normal. There is no distension.     Palpations: Abdomen is soft. There is no mass.     Tenderness: There is no abdominal tenderness.  Musculoskeletal:        General: Normal range of motion.     Right lower leg: No edema.     Left lower leg: No edema.  Neurological:     Mental Status: She is alert and oriented to person, place, and time.  Psychiatric:        Mood and Affect: Mood normal.     CMP Latest Ref Rng & Units 11/22/2019 07/02/2018  Glucose 70 - 99 mg/dL 97 60(L)  BUN 6 - 20 mg/dL 15 17  Creatinine 0.44 - 1.00 mg/dL 0.84 0.97  Sodium 135 - 145 mmol/L 133(L) 141  Potassium 3.5 - 5.1 mmol/L 3.5 4.1  Chloride 98 - 111 mmol/L 100 102  CO2 22 - 32 mmol/L 23 22  Calcium 8.9 - 10.3 mg/dL 9.5 9.8  Total Protein 6.5 - 8.1 g/dL 9.4(H) 7.4  Total Bilirubin 0.3 - 1.2 mg/dL 0.9 0.3  Alkaline Phos 38 - 126 U/L 52 43  AST 15 - 41 U/L 18 19  ALT 0 - 44 U/L 15 19    Lipid Panel  No results found for: CHOL, TRIG, HDL, CHOLHDL, VLDL, LDLCALC, LDLDIRECT  CBC    Component Value Date/Time   WBC 7.7 11/22/2019 1806   RBC 5.49 (H) 11/22/2019 1806   HGB 14.2 11/22/2019 1806   HGB 13.1 07/02/2018 1429   HCT 43.7 11/22/2019 1806   HCT 40.8 07/02/2018 1429   PLT 346 11/22/2019 1806   PLT 201 07/02/2018 1429   MCV 79.6 (L) 11/22/2019 1806   MCV 84 07/02/2018 1429   MCH 25.9 (L) 11/22/2019 1806   MCHC 32.5 11/22/2019 1806   RDW 13.2 11/22/2019 1806   RDW 13.8 07/02/2018 1429   LYMPHSABS 2.0 07/02/2018 1429   EOSABS 0.1 07/02/2018 1429   BASOSABS 0.0 07/02/2018 1429    Lab Results  Component Value Date   HGBA1C 5.0 11/30/2017    Assessment & Plan:  1. Vaginal discharge - Basic Metabolic Panel - Cervicovaginal ancillary only  2. Gassiness Advised to keep a food diary to identify possible causes If symptoms persist will consider H. pylori testing - simethicone (GAS-X) 80 MG chewable tablet; Chew 1 tablet (80 mg total) by mouth every  8 (eight) hours as needed for flatulence.  Dispense: 120 tablet; Refill: 1    Meds ordered this encounter  Medications  . simethicone (GAS-X) 80 MG chewable tablet    Sig: Chew 1 tablet (80 mg total) by mouth every 8 (eight) hours as needed for flatulence.    Dispense:  120 tablet    Refill:  1    Follow-up: Return if symptoms worsen or fail to improve.  Charlott Rakes, MD, FAAFP. [facility] and [place], [place]   11/08/2020, 5:11 PM

## 2020-11-09 LAB — BASIC METABOLIC PANEL
BUN/Creatinine Ratio: 14 (ref 9–23)
BUN: 12 mg/dL (ref 6–20)
CO2: 25 mmol/L (ref 20–29)
Calcium: 9.4 mg/dL (ref 8.7–10.2)
Chloride: 105 mmol/L (ref 96–106)
Creatinine, Ser: 0.85 mg/dL (ref 0.57–1.00)
GFR calc Af Amer: 106 mL/min/{1.73_m2} (ref >59)
GFR calc non Af Amer: 92 mL/min/{1.73_m2} (ref >59)
Glucose: 76 mg/dL (ref 65–99)
Potassium: 3.9 mmol/L (ref 3.5–5.2)
Sodium: 139 mmol/L (ref 134–144)

## 2020-11-10 ENCOUNTER — Other Ambulatory Visit: Payer: Self-pay | Admitting: Family Medicine

## 2020-11-10 LAB — CERVICOVAGINAL ANCILLARY ONLY
Bacterial Vaginitis (gardnerella): NEGATIVE
Candida Glabrata: NEGATIVE
Candida Vaginitis: POSITIVE — AB
Chlamydia: NEGATIVE
Comment: NEGATIVE
Comment: NEGATIVE
Comment: NEGATIVE
Comment: NEGATIVE
Comment: NEGATIVE
Comment: NORMAL
Neisseria Gonorrhea: NEGATIVE
Trichomonas: NEGATIVE

## 2020-11-10 MED ORDER — FLUCONAZOLE 150 MG PO TABS: 150.0000 mg | ORAL_TABLET | Freq: Once | ORAL | 0 refills | Status: AC

## 2021-02-14 ENCOUNTER — Ambulatory Visit: Payer: Medicare Other | Attending: Family Medicine | Admitting: Family Medicine

## 2021-02-14 ENCOUNTER — Encounter: Payer: Self-pay | Admitting: Family Medicine

## 2021-02-14 ENCOUNTER — Other Ambulatory Visit: Payer: Self-pay

## 2021-02-14 DIAGNOSIS — B373 Candidiasis of vulva and vagina: Secondary | ICD-10-CM

## 2021-02-14 DIAGNOSIS — B3731 Acute candidiasis of vulva and vagina: Secondary | ICD-10-CM

## 2021-02-14 MED ORDER — FLUCONAZOLE 150 MG PO TABS: 150.0000 mg | ORAL_TABLET | Freq: Once | ORAL | 3 refills | Status: DC

## 2021-02-14 NOTE — Progress Notes (Signed)
Virtual Visit via Telephone Note  I connected with [name], on 02/14/2021 at 3:54 PM by telephone due to the COVID-19 pandemic and verified that I am speaking with the correct person using two identifiers.   Consent: I discussed the limitations, risks, security and privacy concerns of performing an evaluation and management service by telephone and the availability of in person appointments. I also discussed with the patient that there may be a patient responsible charge related to this service. The patient expressed understanding and agreed to proceed.   Location of Patient: Home  Location of Provider: Clinic   Persons participating in Telemedicine visit: [name] Dr. 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   She has had a vaginal discharge which is brownish and associated itching.  She denies presence of urinary symptoms.  At her last visit in the fall of last year she had similar symptoms which she said resolved with the use of Diflucan and she is requesting that today in addition to refills.  Past Medical History:  Diagnosis Date  . Treacher Collins syndrome    No Known Allergies  Current Outpatient Medications on File Prior to Visit  Medication Sig Dispense Refill  . acetaminophen (TYLENOL) 500 MG tablet Take 500 mg by mouth every 6 (six) hours as needed for moderate pain.    Marland Kitchen dicyclomine (BENTYL) 20 MG tablet Take 1 tablet (20 mg total) by mouth every 12 (twelve) hours as needed (for abdominal pain/cramping). 20 tablet 0  . fluticasone (FLONASE) 50 MCG/ACT nasal spray Place 2 sprays into both nostrils daily. (Patient not taking: No sig reported) 16 g 2  . hydroxypropyl methylcellulose / hypromellose (ISOPTO TEARS / GONIOVISC) 2.5 %  ophthalmic solution Place 1 drop into both eyes 3 (three) times daily as needed for dry eyes.    Marland Kitchen ibuprofen (ADVIL) 600 MG tablet TAKE 1 TABLET(600 MG) BY MOUTH EVERY 12 HOURS AS NEEDED FOR CRAMPING 60 tablet 1  . lactobacillus acidophilus & bulgar (LACTINEX) chewable tablet Chew 1 tablet by mouth 3 (three) times daily with meals. 30 tablet 0  . loratadine (CLARITIN) 10 MG tablet Take 1 tablet (10 mg total) by mouth daily. (Patient not taking: No sig reported) 30 tablet 2  . mirabegron ER (MYRBETRIQ) 50 MG TB24 tablet Take 50 mg by mouth daily.     . Multiple Vitamin (MULTIVITAMIN WITH MINERALS) TABS tablet Take 1 tablet by mouth daily.    . naproxen (NAPROSYN) 500 MG tablet Take 1 tablet (500 mg total) by mouth 2 (two) times daily. 30 tablet 0  . polyethylene glycol powder (GLYCOLAX/MIRALAX) 17 GM/SCOOP powder Take 17 g by mouth daily. 3350 g 1  . simethicone (GAS-X) 80 MG chewable tablet Chew 1 tablet (80 mg total) by mouth every 8 (eight) hours as needed for flatulence. 120 tablet 1   No current facility-administered medications on file prior to visit.    ROS: See HPI  Observations/Objective: Awake, alert, oriented x3 Not in acute distress Speaks in full sentences Normal mood  Assessment and Plan: 1. Vaginal candidiasis - fluconazole (DIFLUCAN) 150 MG tablet; Take 1 tablet (150 mg total) by mouth once for 1 dose.  Dispense: 1 tablet; Refill: 3   Follow Up Instructions: Patient will call to schedule future appointment   I discussed  the assessment and treatment plan with the patient. The patient was provided an opportunity to ask questions and all were answered. The patient agreed with the plan and demonstrated an understanding of the instructions.   The patient was advised to call back or seek an in-person evaluation if the symptoms worsen or if the condition fails to improve as anticipated.     I provided 7 minutes total of non-face-to-face time during this  encounter.   Charlott Rakes, MD, FAAFP. [facility] and [place], [place]   02/14/2021, 3:54 PM

## 2021-06-15 ENCOUNTER — Other Ambulatory Visit: Payer: Self-pay | Admitting: Family Medicine

## 2021-06-15 DIAGNOSIS — B3731 Acute candidiasis of vulva and vagina: Secondary | ICD-10-CM

## 2021-06-15 DIAGNOSIS — B373 Candidiasis of vulva and vagina: Secondary | ICD-10-CM

## 2021-06-29 ENCOUNTER — Ambulatory Visit: Payer: Medicare Other | Admitting: Physician Assistant

## 2021-07-21 ENCOUNTER — Ambulatory Visit: Payer: Medicare Other | Attending: Physician Assistant | Admitting: Physician Assistant

## 2021-07-21 ENCOUNTER — Other Ambulatory Visit: Payer: Self-pay

## 2021-07-21 ENCOUNTER — Encounter: Payer: Self-pay | Admitting: Physician Assistant

## 2021-07-21 VITALS — BP 104/58 | HR 84 | Ht 63.0 in | Wt 130.5 lb

## 2021-07-21 DIAGNOSIS — N390 Urinary tract infection, site not specified: Secondary | ICD-10-CM

## 2021-07-21 DIAGNOSIS — E871 Hypo-osmolality and hyponatremia: Secondary | ICD-10-CM | POA: Diagnosis not present

## 2021-07-21 DIAGNOSIS — R350 Frequency of micturition: Secondary | ICD-10-CM

## 2021-07-21 LAB — POCT URINALYSIS DIP (CLINITEK)
Bilirubin, UA: NEGATIVE
Blood, UA: NEGATIVE
Glucose, UA: NEGATIVE mg/dL
Ketones, POC UA: NEGATIVE mg/dL
Leukocytes, UA: NEGATIVE
Nitrite, UA: NEGATIVE
POC PROTEIN,UA: NEGATIVE
Spec Grav, UA: 1.02 (ref 1.010–1.025)
Urobilinogen, UA: 0.2 E.U./dL
pH, UA: 7 (ref 5.0–8.0)

## 2021-07-21 NOTE — Progress Notes (Signed)
Caitlin Banks, is a 30 y.o. female  [id number]  [id number]  DOB - [birth year]  Chief Complaint  Patient presents with   Urinary Frequency       Subjective:   Caitlin Banks is a 30 y.o. female here today for a referral to urology.  she has ongoing urinary frequency for years that is bothersome to her.  No dysuria.  No fever.  No abdominal pain  No problems updated.  ALLERGIES: No Known Allergies  PAST MEDICAL HISTORY: Past Medical History:  Diagnosis Date   Treacher Collins syndrome     MEDICATIONS AT HOME: Prior to Admission medications   Medication Sig Start Date End Date Taking? Authorizing Provider  hydroxypropyl methylcellulose / hypromellose (ISOPTO TEARS / GONIOVISC) 2.5 % ophthalmic solution Place 1 drop into both eyes 3 (three) times daily as needed for dry eyes.   Yes [provider]  ibuprofen (ADVIL) 600 MG tablet TAKE 1 TABLET(600 MG) BY MOUTH EVERY 12 HOURS AS NEEDED FOR CRAMPING 09/26/20  Yes Newlin, Enobong, MD  mirabegron ER (MYRBETRIQ) 50 MG TB24 tablet Take 50 mg by mouth daily.  08/07/18  Yes [provider]  Multiple Vitamin (MULTIVITAMIN WITH MINERALS) TABS tablet Take 1 tablet by mouth daily.   Yes [provider]  polyethylene glycol powder (GLYCOLAX/MIRALAX) 17 GM/SCOOP powder Take 17 g by mouth daily. 12/10/19  Yes Charlott Rakes, MD  simethicone (GAS-X) 80 MG chewable tablet Chew 1 tablet (80 mg total) by mouth every 8 (eight) hours as needed for flatulence. 11/08/20  Yes Charlott Rakes, MD  acetaminophen (TYLENOL) 500 MG tablet Take 500 mg by mouth every 6 (six) hours as needed for moderate pain. Patient not taking: Reported on 07/21/2021    [provider]  dicyclomine (BENTYL) 20 MG tablet Take 1 tablet (20 mg total) by mouth every 12 (twelve) hours as needed (for abdominal pain/cramping). Patient not taking: Reported on 07/21/2021 11/23/19   Antonietta Breach, PA-C  fluconazole (DIFLUCAN) 150 MG tablet TAKE 1  TABLET(150 MG) BY MOUTH 1 TIME FOR 1 DOSE Patient not taking: Reported on 07/21/2021 06/15/21   Charlott Rakes, MD  fluticasone (FLONASE) 50 MCG/ACT nasal spray Place 2 sprays into both nostrils daily. Patient not taking: No sig reported 03/12/17   Alfonse Spruce, FNP  lactobacillus acidophilus & bulgar (LACTINEX) chewable tablet Chew 1 tablet by mouth 3 (three) times daily with meals. Patient not taking: Reported on 07/21/2021 11/23/19   Antonietta Breach, PA-C  loratadine (CLARITIN) 10 MG tablet Take 1 tablet (10 mg total) by mouth daily. Patient not taking: No sig reported 03/12/17   Alfonse Spruce, FNP  naproxen (NAPROSYN) 500 MG tablet Take 1 tablet (500 mg total) by mouth 2 (two) times daily. Patient not taking: Reported on 07/21/2021 11/23/19   Antonietta Breach, PA-C    ROS: Neg HEENT Neg resp Neg cardiac Neg GI Neg GU Neg MS Neg psych Neg neuro  Objective:   Vitals:   07/21/21 1540  BP: (!) 104/58  Pulse: 84  SpO2: 99%  Weight: 130 lb 8 oz (59.2 kg)  Height: '5\' 3"'$  (1.6 m)   Exam General appearance : Awake, alert, not in any distress. Speech Clear. Not toxic looking HEENT: Atraumatic and NormocephaliNeck: Supple, no JVD. No cervical lymphadenopathy.  Chest: Good air entry bilaterally, CTAB.  No rales/rhonchi/wheezing CVS: S1 S2 regular, no murmurs.  Extremities: B/L Lower Ext shows no edema, both legs are warm to touch Neurology: Awake alert, and oriented X 3, CN II-XII  intact, Non focal Skin: No Rash  Data Review Lab Results  Component Value Date   HGBA1C 5.0 11/30/2017    Assessment & Plan   1. Urinary tract infection without hematuria, site unspecified No infection - POCT URINALYSIS DIP (CLINITEK)  2. Urinary frequency - Ambulatory referral to Urology - Comprehensive metabolic panel  3. Hyponatremia If sodium is persistently low, I will refer to endocrinology - Comprehensive metabolic panel    Patient have been counseled extensively about  nutrition and exercise. Other issues discussed during this visit include: low cholesterol diet, weight control and daily exercise, foot care, annual eye examinations at Ophthalmology, importance of adherence with medications and regular follow-up. We also discussed long term complications of uncontrolled diabetes and hypertension.   Return in about 6 months (around 01/21/2022) for PCP.  The patient was given clear instructions to go to ER or return to medical center if symptoms don't improve, worsen or new problems develop. The patient verbalized understanding. The patient was told to call to get lab results if they haven't heard anything in the next week.      Freeman Caldron, PA-C [facility] and [place], [place]   07/21/2021, 3:55 PM Patient ID: Caitlin Banks, female   DOB: [birth year], 30 y.o.   MRN: [id number]

## 2021-07-21 NOTE — Progress Notes (Signed)
Referral to a urologist

## 2021-07-22 LAB — COMPREHENSIVE METABOLIC PANEL
ALT: 15 IU/L (ref 0–32)
AST: 16 IU/L (ref 0–40)
Albumin/Globulin Ratio: 1.6 (ref 1.2–2.2)
Albumin: 4.4 g/dL (ref 3.8–4.8)
Alkaline Phosphatase: 43 IU/L — ABNORMAL LOW (ref 44–121)
BUN/Creatinine Ratio: 14 (ref 9–23)
BUN: 11 mg/dL (ref 6–20)
Bilirubin Total: 0.3 mg/dL (ref 0.0–1.2)
CO2: 22 mmol/L (ref 20–29)
Calcium: 9.2 mg/dL (ref 8.7–10.2)
Chloride: 104 mmol/L (ref 96–106)
Creatinine, Ser: 0.8 mg/dL (ref 0.57–1.00)
Globulin, Total: 2.8 g/dL (ref 1.5–4.5)
Glucose: 99 mg/dL (ref 65–99)
Potassium: 4.1 mmol/L (ref 3.5–5.2)
Sodium: 139 mmol/L (ref 134–144)
Total Protein: 7.2 g/dL (ref 6.0–8.5)
eGFR: 100 mL/min/{1.73_m2} (ref >59)

## 2021-08-24 ENCOUNTER — Telehealth: Payer: Self-pay

## 2021-08-24 ENCOUNTER — Other Ambulatory Visit: Payer: Self-pay

## 2021-08-24 ENCOUNTER — Encounter: Payer: Self-pay | Admitting: Physician Assistant

## 2021-08-24 ENCOUNTER — Ambulatory Visit: Payer: Medicare Other | Attending: Physician Assistant | Admitting: Physician Assistant

## 2021-08-24 VITALS — BP 103/69 | HR 82 | Resp 16 | Wt 132.2 lb

## 2021-08-24 DIAGNOSIS — N390 Urinary tract infection, site not specified: Secondary | ICD-10-CM

## 2021-08-24 DIAGNOSIS — R102 Pelvic and perineal pain: Secondary | ICD-10-CM | POA: Diagnosis not present

## 2021-08-24 LAB — POCT URINALYSIS DIP (CLINITEK)
Bilirubin, UA: NEGATIVE
Glucose, UA: NEGATIVE mg/dL
Ketones, POC UA: NEGATIVE mg/dL
Leukocytes, UA: NEGATIVE
Nitrite, UA: NEGATIVE
POC PROTEIN,UA: NEGATIVE
Spec Grav, UA: >=1.03 — AB (ref 1.010–1.025)
Urobilinogen, UA: 0.2 E.U./dL
pH, UA: 5.5 (ref 5.0–8.0)

## 2021-08-24 NOTE — Telephone Encounter (Signed)
Patient's ride from [facility] did not arrive to transport her home. She contacted [facility] and was told at 1800 that it would be about an hour before a car arrived.  Melburn Popper then order to transport patient to her home address - [address], [place]. Driver: [employer] [phone]. Patient confirmed her address with the driver when she got in the car.

## 2021-08-24 NOTE — Progress Notes (Signed)
Patient ID: Caitlin Banks, female   DOB: [birth year], 30 y.o.   MRN: [id number]   Caitlin Banks, is a 30 y.o. female  ZCH:[id number]  [id number]:[id number]  DOB - 12/11/1990  Chief Complaint  Patient presents with   Urinary Tract Infection       Subjective:   Caitlin Banks is a 30 y.o. female here today for occasional dysuria with h/o UTI.  She is also having occasional vaginal pain and wants to see a gynecologist.  Recent CMP unremarkable.  No flank pain.  No fever.    No problems updated.  ALLERGIES: No Known Allergies  PAST MEDICAL HISTORY: Past Medical History:  Diagnosis Date   Treacher Collins syndrome     MEDICATIONS AT HOME: Prior to Admission medications   Medication Sig Start Date End Date Taking? Authorizing Provider  acetaminophen (TYLENOL) 500 MG tablet Take 500 mg by mouth every 6 (six) hours as needed for moderate pain.   Yes [provider]  fluconazole (DIFLUCAN) 150 MG tablet TAKE 1 TABLET(150 MG) BY MOUTH 1 TIME FOR 1 DOSE 06/15/21  Yes Newlin, Enobong, MD  fluticasone (FLONASE) 50 MCG/ACT nasal spray Place 2 sprays into both nostrils daily. 03/12/17  Yes Hairston, Mandesia R, FNP  ibuprofen (ADVIL) 600 MG tablet TAKE 1 TABLET(600 MG) BY MOUTH EVERY 12 HOURS AS NEEDED FOR CRAMPING 09/26/20  Yes Newlin, Enobong, MD  mirabegron ER (MYRBETRIQ) 50 MG TB24 tablet Take 50 mg by mouth daily.  08/07/18  Yes [provider]  Multiple Vitamin (MULTIVITAMIN WITH MINERALS) TABS tablet Take 1 tablet by mouth daily.   Yes [provider]  naproxen (NAPROSYN) 500 MG tablet Take 1 tablet (500 mg total) by mouth 2 (two) times daily. 11/23/19  Yes Antonietta Breach, PA-C  polyethylene glycol powder (GLYCOLAX/MIRALAX) 17 GM/SCOOP powder Take 17 g by mouth daily. 12/10/19  Yes Charlott Rakes, MD  simethicone (GAS-X) 80 MG chewable tablet Chew 1 tablet (80 mg total) by mouth every 8 (eight) hours as needed for flatulence. 11/08/20  Yes Charlott Rakes, MD  dicyclomine  (BENTYL) 20 MG tablet Take 1 tablet (20 mg total) by mouth every 12 (twelve) hours as needed (for abdominal pain/cramping). Patient not taking: Reported on 08/24/2021 11/23/19   Antonietta Breach, PA-C  hydroxypropyl methylcellulose / hypromellose (ISOPTO TEARS / GONIOVISC) 2.5 % ophthalmic solution Place 1 drop into both eyes 3 (three) times daily as needed for dry eyes. Patient not taking: Reported on 08/24/2021    [provider]  lactobacillus acidophilus & bulgar (LACTINEX) chewable tablet Chew 1 tablet by mouth 3 (three) times daily with meals. Patient not taking: Reported on 08/24/2021 11/23/19   Antonietta Breach, PA-C  loratadine (CLARITIN) 10 MG tablet Take 1 tablet (10 mg total) by mouth daily. Patient not taking: Reported on 08/24/2021 03/12/17   Alfonse Spruce, FNP    ROS: Neg HEENT Neg resp Neg cardiac Neg GI Neg GU Neg MS Neg psych Neg neuro  Objective:   Vitals:   08/24/21 1602  BP: 103/69  Pulse: 82  Resp: 16  SpO2: 100%  Weight: 132 lb 3.2 oz (60 kg)   Exam General appearance : Awake, alert, not in any distress. Speech difficult to understand. Not toxic looking HEENT: Atraumatic and Normocephalic Neck: Supple, no JVD. No cervical lymphadenopathy.  Chest: Good air entry bilaterally, CTAB.  No rales/rhonchi/wheezing CVS: S1 S2 regular, no murmurs.  Extremities: B/L Lower Ext shows no edema, both legs are warm to touch Neurology: Awake alert, and oriented  X 3, CN II-XII intact, Non focal Skin: No Rash  Data Review Lab Results  Component Value Date   HGBA1C 5.0 11/30/2017    Assessment & Plan   1. Urinary tract infection without hematuria, site unspecified Increase water intake - POCT URINALYSIS DIP (CLINITEK) - Urine Culture  2. Vaginal pain - Ambulatory referral to Gynecology    Patient have been counseled extensively about nutrition and exercise. Other issues discussed during this visit include: low cholesterol diet, weight control and daily  exercise, foot care, annual eye examinations at Ophthalmology, importance of adherence with medications and regular follow-up. We also discussed long term complications of uncontrolled diabetes and hypertension.   Return if symptoms worsen or fail to improve.  The patient was given clear instructions to go to ER or return to medical center if symptoms don't improve, worsen or new problems develop. The patient verbalized understanding. The patient was told to call to get lab results if they haven't heard anything in the next week.      Freeman Caldron, PA-C [facility] [place], [place]   08/24/2021, 4:30 PM

## 2021-08-24 NOTE — Progress Notes (Signed)
Patient needs a referral to the gynecologist

## 2021-08-24 NOTE — Patient Instructions (Signed)
Drink 80-100 ounces water daily 

## 2021-09-08 ENCOUNTER — Ambulatory Visit: Payer: Medicare Other | Attending: Family Medicine | Admitting: Physical Therapy

## 2021-09-08 ENCOUNTER — Other Ambulatory Visit: Payer: Self-pay

## 2021-09-08 ENCOUNTER — Encounter: Payer: Self-pay | Admitting: Physical Therapy

## 2021-09-08 DIAGNOSIS — M6281 Muscle weakness (generalized): Secondary | ICD-10-CM | POA: Diagnosis not present

## 2021-09-08 DIAGNOSIS — R279 Unspecified lack of coordination: Secondary | ICD-10-CM | POA: Diagnosis not present

## 2021-09-08 NOTE — Therapy (Signed)
[facility] @ [place], [place], [address] Phone: [phone]   Fax:  [phone]  Physical Therapy Evaluation  Patient Details  Name: Caitlin Banks MRN: [id number] Date of Birth: 10/14/1991 Referring Provider (PT): R10.2 (ICD-10-CM) - Vaginal pain   Encounter Date: 09/08/2021   PT End of Session - 09/08/21 1302     Visit Number 1    Date for PT Re-Evaluation 12/09/21    Authorization Type UHC medicare    PT Start Time 1230    PT Stop Time 1310    PT Time Calculation (min) 40 min    Activity Tolerance Patient tolerated treatment well    Behavior During Therapy [facility] for tasks assessed/performed             Past Medical History:  Diagnosis Date   Treacher Collins syndrome     Past Surgical History:  Procedure Laterality Date   EXTERNAL EAR SURGERY      There were no vitals filed for this visit.    Subjective Assessment - 09/08/21 1234     Subjective Pt reports she has urinary urgency and frequency going ~1.5-2 hours between voids and has been going on for a long time unable to say for sure. Pt reports sometimes it feels like she is not able to fully empty. 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 no limits    How long can you stand comfortably? no limits    How long can you walk comfortably? no limits    Patient Stated Goals to have less frequent urination.    Currently in Pain? No/denies                [facility] PT Assessment - 09/08/21 0001       Assessment   Medical Diagnosis Argentina Donovan, PA-C    Referring Provider (PT) R10.2 (ICD-10-CM) - Vaginal pain    Onset Date/Surgical Date --   chronic history   Prior Therapy no      Precautions   Precautions None       Restrictions   Weight Bearing Restrictions No      Balance Screen   Has the patient fallen in the past 6 months No    Has the patient had a decrease in activity level because of a fear of falling?  No    Is the patient reluctant to leave their home because of a fear of falling?  No      Home Social worker Private residence    Living Arrangements Parent    Available Help at Discharge Family      Prior Function   Level of Independence Independent      Cognition   Overall Cognitive Status History of cognitive impairments - at baseline   per chart review pt has h/o developmental delay     Sensation   Light Touch Appears Intact      Coordination   Gross Motor Movements are Fluid and Coordinated Yes    Fine Motor Movements are Fluid and Coordinated Yes      Posture/Postural Control   Posture/Postural Control Postural limitations    Postural Limitations Rounded Shoulders;Posterior pelvic tilt      ROM / Strength   AROM / PROM / Strength  AROM;Strength      AROM   Overall AROM Comments WFL      Strength   Overall Strength Comments bil hips grossly 3+/5 with exception of abduction 3/5      Flexibility   Soft Tissue Assessment /Muscle Length yes   decreased in bil hamstrings and adductors by 25%     Palpation   Spinal mobility Rt posterior rotation of shoulder and scapula, mild posterior Rt hip rotation as well    Palpation comment no TTP                        Objective measurements completed on examination: See above findings.     Pelvic Floor Special Questions - 09/08/21 0001     Prior Pelvic/Prostate Exam Yes   normal recent visit   Are you Pregnant or attempting pregnancy? No    Prior Pregnancies No    Currently Sexually Active No    History of sexually transmitted disease No    Urinary Leakage No    Urinary urgency Yes    Urinary frequency yes - 1.5-2 hours between voids    Fecal incontinence No    Fluid intake feels like she  drinks enough water but unable to recall amounts.    Caffeine beverages sodas a couple times per week, juice a lot during the day but unable to say how much    Falling out feeling (prolapse) No    Pelvic Floor Internal Exam deferred at this time                       PT Education - 09/08/21 1249     Education Details Pt educated on exam findings, bladder irritants, urge drill and bladder retraining    Person(s) Educated Patient    Methods Explanation;Demonstration;Tactile cues;Handout;Verbal cues    Comprehension Returned demonstration;Verbalized understanding                         Plan - 09/08/21 1312     Clinical Impression Statement Pt is 30 yo female with history of 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 Pt referred to clinic for vaginal pain and urgency. Pt denied additional vaginal pain any longer and reports only a rare instance of abdominal pain when bladder is too full. Pt demonstrated required extra time for all tasks and explaination to insure her understanding of questions and tasks and handouts for home. Internal pelvic assessment deferred at this time as pt had difficulty fully understanding this and reasonings for assessment. Pt found to have Rt posterior rotation of spine with notable Rt scapula posterior to Lt and mild Rt hip posterior rotation as well. Pt found to have decreased hip strength and mobility bil. Pt denied pain with palpation in abdomen, glutes, adductors, hamstrings, pubic bone and spine when assessed, all assessed over clothing for pt comfort. Pt tolerated well and denied all pain. Pt educated on findings, given handouts for urge drill and bladder irritants. Pt reports drinking a large amount of juice daily and intermittent sodas during the week, educated to decrease this intake and went over  all handouts with extra time at end of session. After this, pt denied questions and verbalized return of information to therapist. Pt would benefit from additional PT to address deficits.    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, inplantable auditory devices, developmental delay    Examination-Activity Limitations Continence    Examination-Participation Restrictions Community Activity    Stability/Clinical Decision Making Stable/Uncomplicated    Clinical Decision Making Low    Rehab Potential Good    PT Frequency 1x / week    PT Duration 8 weeks    PT Treatment/Interventions ADLs/Self Care Home Management;Functional mobility training;Therapeutic activities;Therapeutic exercise;Neuromuscular re-education;Manual techniques;Patient/family education;Passive range of motion;Energy conservation;Taping    PT Next Visit Plan go over all handouts, hip strengthening    PT Home Exercise Plan urge drill and bladder irritants    Consulted and Agree with Plan of Care Patient             Patient will benefit from skilled therapeutic intervention in order to improve the following deficits and impairments:  Decreased coordination, Decreased endurance, Improper body mechanics, Impaired flexibility, Postural dysfunction, Decreased strength  Visit Diagnosis: Lack of coordination - Plan: PT plan of care cert/re-cert  Muscle weakness (generalized) - Plan: PT plan of care cert/re-cert     Problem List Patient Active Problem List   Diagnosis Date Noted   Treacher Collins syndrome 03/12/2017    Stacy Gardner, PT, DPT 10/13/221:22 PM   [facility] @ [place], [place], [address] Phone: [phone]   Fax:  [phone]  Name: Caitlin Banks MRN: [id number] Date of Birth: [birth year]

## 2021-09-08 NOTE — Patient Instructions (Signed)
Bladder Irritants  Certain foods and beverages can be irritating to the bladder.  Avoiding these irritants may decrease your symptoms of urinary urgency, frequency or bladder pain.  Even reducing your intake can help with your symptoms.  Not everyone is sensitive to all bladder irritants, so you may consider focusing on one irritant at a time, removing or reducing your intake of that irritant for 7-10 days to see if this change helps your symptoms.  Water intake is also very important.  Below is a list of bladder irritants.  Drinks: alcohol, carbonated beverages, caffeinated beverages such as coffee and tea, drinks with artificial sweeteners, citrus juices, apple juice, tomato juice  Foods: tomatoes and tomato based foods, spicy food, sugar and artificial sweeteners, vinegar, chocolate, raw onion, apples, citrus fruits, pineapple, cranberries, tomatoes, strawberries, plums, peaches, cantaloupe  Other: acidic urine (too concentrated) - see water intake info below  Substitutes you can try that are NOT irritating to the bladder: cooked onion, pears, papayas, sun-brewed decaf teas, watermelons, non-citrus herbal teas, apricots, kava and low-acid instant drinks (Postum).    WATER INTAKE: Remember to drink lots of water (aim for fluid intake of half your body weight with 2/3 of fluids being water).  You may be limiting fluids due to fear of leakage, but this can actually worsen urgency symptoms due to highly concentrated urine.  Water helps balance the pH of your urine so it doesn't become too acidic - acidic urine is a bladder irritant!   Urge Incontinence  Ideal urination frequency is every 2-4 wakeful hours, which equates to 5-8 times within a 24-hour period.   Urge incontinence is leakage that occurs when the bladder muscle contracts, creating a sudden need to go before getting to the bathroom.   Going too often when your bladder isn't actually full can disrupt the body's automatic signals to  store and hold urine longer, which will increase urgency/frequency.  In this case, the bladder "is running the show" and strategies can be learned to retrain this pattern.   One should be able to control the first urge to urinate, at around 176mL.  The bladder can hold up to a "grande latte," or 426mL. To help you gain control, practice the Urge Drill below when urgency strikes.  This drill will help retrain your bladder signals and allow you to store and hold urine longer.  The overall goal is to stretch out your time between voids to reach a more manageable voiding schedule.    Practice your "quick flicks" often throughout the day (each waking hour) even when you don't need feel the urge to go.  This will help strengthen your pelvic floor muscles, making them more effective in controlling leakage.  Urge Drill  When you feel an urge to go, follow these steps to regain control: Stop what you are doing and be still Take one deep breath, directing your air into your abdomen Think an affirming thought, such as "I've got this." Do 5 quick flicks of your pelvic floor (quickly contract and relax pelvic floor) Walk with control to the bathroom to void, or delay voiding

## 2021-09-23 ENCOUNTER — Other Ambulatory Visit: Payer: Self-pay | Admitting: Family Medicine

## 2021-09-23 DIAGNOSIS — B3731 Acute candidiasis of vulva and vagina: Secondary | ICD-10-CM

## 2021-09-23 NOTE — Telephone Encounter (Signed)
Requested medication (s) are due for refill today: Yes  Requested medication (s) are on the active medication list: Yes  Last refill:  06/15/21  Future visit scheduled: No  Notes to clinic:  See request.    Requested Prescriptions  Pending Prescriptions Disp Refills   fluconazole (DIFLUCAN) 150 MG tablet [Pharmacy Med Name: FLUCONAZOLE 150MG  TABLETS] 1 tablet 3    Sig: TAKE 1 TABLET(150 MG) BY MOUTH 1 TIME FOR 1 DOSE     Off-Protocol Failed - 09/23/2021  3:43 AM      Failed - Medication not assigned to a protocol, review manually.      Passed - Valid encounter within last 12 months    Recent Outpatient Visits           1 month ago Urinary tract infection without hematuria, site unspecified   [facility] [place], [place], [place]   2 months ago Urinary tract infection without hematuria, site unspecified   [facility] [place], [place], [place]   7 months ago Vaginal candidiasis   [place], Charlane Ferretti, MD   10 months ago Vaginal discharge   [place], Enobong, MD   11 months ago Treacher Theda Sers syndrome   [facility] Charlott Rakes, MD

## 2021-09-24 ENCOUNTER — Other Ambulatory Visit: Payer: Self-pay | Admitting: Family Medicine

## 2021-09-24 DIAGNOSIS — B3731 Acute candidiasis of vulva and vagina: Secondary | ICD-10-CM

## 2021-10-03 ENCOUNTER — Other Ambulatory Visit: Payer: Self-pay

## 2021-10-03 ENCOUNTER — Ambulatory Visit: Payer: Medicare Other | Attending: Family Medicine | Admitting: Physical Therapy

## 2021-10-03 DIAGNOSIS — M6281 Muscle weakness (generalized): Secondary | ICD-10-CM | POA: Insufficient documentation

## 2021-10-03 DIAGNOSIS — R279 Unspecified lack of coordination: Secondary | ICD-10-CM | POA: Diagnosis not present

## 2021-10-03 DIAGNOSIS — R293 Abnormal posture: Secondary | ICD-10-CM | POA: Insufficient documentation

## 2021-10-03 NOTE — Therapy (Signed)
[facility] @ [place], [place], [address] Phone: [phone]   Fax:  [phone]  Physical Therapy Treatment  Patient Details  Name: Caitlin Banks MRN: [id number] Date of Birth: 1991-03-15 Referring Provider (PT): R10.2 (ICD-10-CM) - Vaginal pain   Encounter Date: 10/03/2021   PT End of Session - 10/03/21 1308     Visit Number 2    Date for PT Re-Evaluation 12/09/21    Authorization Type [employer] medicare    PT Start Time 1230    PT Stop Time 1309    PT Time Calculation (min) 39 min    Activity Tolerance Patient tolerated treatment well    Behavior During Therapy WFL for tasks assessed/performed             Past Medical History:  Diagnosis Date   Treacher Collins syndrome     Past Surgical History:  Procedure Laterality Date   EXTERNAL EAR SURGERY      There were no vitals filed for this visit.   Subjective Assessment - 10/03/21 1232     Subjective Pt reports she is now able to hold urine to every 2 hours sometimes longer, no leakage and feels like she is able to fully empty most of the time. Pt reports she was able to cut back juice which she thinks helped. Pt denied all pain currently and reports she has not had any since last visit.    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 no limits    How long can you stand comfortably? no limits    How long can you walk comfortably? no limits    Currently in Pain? No/denies                               [facility] Adult PT Treatment/Exercise - 10/03/21 0001       Self-Care   Self-Care Other Self-Care Comments    Other Self-Care Comments  Pt educated on bladder retraining and urge drill with PT discussing with pt techinques to increase time between voids and reps of practice in clinic. PT went over urge drill  however pt demonstrated inability to understand this and PT went over this with goal being to urinate ~2.5 hours to increase time between voids and to attempt deep breaths if before 2 hours between voids and try to retrain bladder for longer void times. Pt able to verbalize this back to PT with instructions written down for pt.                     PT Education - 10/03/21 1248     Education Details Pt educated on urge drill and bladder retraining.    Person(s) Educated Patient    Methods Explanation;Demonstration;Tactile cues;Verbal cues    Comprehension Verbalized understanding;Returned demonstration                        Plan - 10/03/21 1309     Clinical Impression Statement Pt presents to clinic reporting she is now able to hold urine for 2 hours between voids without leakage and reports she is able to fully empty bladder after voids. Pt required increased time to fully understand all questions and multiple attempts to insure correct understanding of communication. Pt educated on urge drill during this  session and required increased time and ultimately given urge drill handout and PT wrote single step plan for urge drill with goal being to void at 2.5 hours to increase time between voids per pt's goal. Pt able to verbalize when and how to attempt to retrain bladder with these written cues for help. At end of session pt reoprted she no longer is bothered by how often she was urinating, "that was before" per pt and "I don't feel that way now". Pt reports she would like to return to clinic for next visit to make sure she can follow instructions and see if this helps but states she is feeling better since last session. Pt reports cutting back juice at home has helped a lot with urgency and due to pt needing extra time and multiple ways of explainations during session pt would benefit from additional session to insure she was able to complete task and improve bladder retraining.     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, inplantable auditory devices, developmental delay    Examination-Activity Limitations Continence    Examination-Participation Restrictions Community Activity    Stability/Clinical Decision Making Stable/Uncomplicated    Rehab Potential Good    PT Frequency 1x / week    PT Duration 8 weeks    PT Treatment/Interventions ADLs/Self Care Home Management;Functional mobility training;Therapeutic activities;Therapeutic exercise;Neuromuscular re-education;Manual techniques;Patient/family education;Passive range of motion;Energy conservation;Taping    PT Next Visit Plan go over time between voids    PT Home Exercise Plan urge drill and bladder irritants    Consulted and Agree with Plan of Care Patient             Patient will benefit from skilled therapeutic intervention in order to improve the following deficits and impairments:  Decreased coordination, Decreased endurance, Improper body mechanics, Impaired flexibility, Postural dysfunction, Decreased strength  Visit Diagnosis: Muscle weakness (generalized)  Lack of coordination     Problem List Patient Active Problem List   Diagnosis Date Noted   Treacher Collins syndrome 03/12/2017    Stacy Gardner, PT, DPT 11/07/221:17 PM   [facility] @ [place], [place], [address] Phone: [phone]   Fax:  [phone]  Name: Caitlin Banks MRN: [id number] Date of Birth: July 28, 1991

## 2021-10-05 ENCOUNTER — Other Ambulatory Visit (HOSPITAL_COMMUNITY)
Admission: RE | Admit: 2021-10-05 | Discharge: 2021-10-05 | Disposition: A | Discharge status: Home / Self Care | Payer: Medicare Other | Source: Ambulatory Visit | Attending: Certified Nurse Midwife | Admitting: Certified Nurse Midwife

## 2021-10-05 ENCOUNTER — Ambulatory Visit (INDEPENDENT_AMBULATORY_CARE_PROVIDER_SITE_OTHER): Payer: Medicare Other | Admitting: Certified Nurse Midwife

## 2021-10-05 ENCOUNTER — Other Ambulatory Visit: Payer: Self-pay

## 2021-10-05 VITALS — BP 108/74 | HR 97 | Temp 97.7°F | Ht 63.0 in | Wt 131.6 lb

## 2021-10-05 DIAGNOSIS — N898 Other specified noninflammatory disorders of vagina: Secondary | ICD-10-CM

## 2021-10-05 DIAGNOSIS — B3731 Acute candidiasis of vulva and vagina: Secondary | ICD-10-CM | POA: Diagnosis not present

## 2021-10-06 LAB — CERVICOVAGINAL ANCILLARY ONLY
Bacterial Vaginitis (gardnerella): NEGATIVE
Candida Glabrata: NEGATIVE
Candida Vaginitis: POSITIVE — AB
Comment: NEGATIVE
Comment: NEGATIVE
Comment: NEGATIVE

## 2021-10-07 MED ORDER — TERCONAZOLE 0.4 % VA CREA: 1.0000 | TOPICAL_CREAM | Freq: Every day | VAGINAL | 0 refills | Status: DC

## 2021-10-07 NOTE — Progress Notes (Signed)
History:  Ms. Caitlin Banks is a 30 y.o. nulliparous female who presents to clinic today for intermittent brown discharge. She is not and has never been sexually active. She has a history of urinary urge incontinence and is undergoing pelvic floor and urinary training to prevent leakage from a urologist. Her mother is concerned her incontinence will affect her ability to get pregnant. Does not note any abnormal discharge (other than the brown). No other gynecological or physical complaints.   The following portions of the patient's history were reviewed and updated as appropriate: allergies, current medications, family history, past medical history, social history, past surgical history and problem list.  Review of Systems:  Pertinent items noted in HPI and remainder of comprehensive ROS otherwise negative.    Objective:  Physical Exam BP 108/74 (BP Location: Left Arm, Patient Position: Sitting, Cuff Size: Normal)   Pulse 97   Temp 97.7 F (36.5 C) (Oral)   Ht 5\' 3"  (1.6 m)   Wt 131 lb 9.6 oz (59.7 kg)   LMP 09/12/2021 (Exact Date)   BMI 23.31 kg/m  Physical Exam Vitals and nursing note reviewed.  Constitutional:      Appearance: Normal appearance.  HENT:     Ears:     Comments: Has bilateral cochlear implants and reads lips to aid her hearing perception.  Eyes:     Pupils: Pupils are equal, round, and reactive to light.  Cardiovascular:     Rate and Rhythm: Normal rate and regular rhythm.     Pulses: Normal pulses.  Pulmonary:     Effort: Pulmonary effort is normal.  Abdominal:     Palpations: Abdomen is soft.  Genitourinary:    General: Normal vulva.  Musculoskeletal:        General: Normal range of motion.  Skin:    General: Skin is warm.     Capillary Refill: Capillary refill takes less than 2 seconds.  Neurological:     Mental Status: She is alert and oriented to person, place, and time.  Psychiatric:        Mood and Affect: Mood normal.        Behavior: Behavior  normal.        Thought Content: Thought content normal.        Judgment: Judgment normal.   Labs and Imaging No results found for this or any previous visit (from the past 24 hour(s)).  No results found.  Assessment & Plan:  1. Vaginal discharge - Cervicovaginal ancillary only  Reassured pt's mom that since her bladder is structurally normal (per mom, urologist tested), her bladder issues should not affect her ability to conceive. Encouraged her to continue therapies to strengthen her pelvic floor so she is able to enjoy sex when ready and carry a baby comfortably.  Follow up PRN.   Gabriel Carina, CNM 10/07/2021 3:20 PM

## 2021-10-10 ENCOUNTER — Other Ambulatory Visit: Payer: Self-pay | Admitting: Family Medicine

## 2021-10-10 DIAGNOSIS — B3731 Acute candidiasis of vulva and vagina: Secondary | ICD-10-CM

## 2021-10-10 NOTE — Telephone Encounter (Signed)
Requested medications are due for refill today has newer rx  Requested medications are on the active medication list yes  Last refill 11/10/20  Last visit 08/24/21  Future visit scheduled no  Notes to clinic This medication can not be delegated, please assess.  Requested Prescriptions  Pending Prescriptions Disp Refills   fluconazole (DIFLUCAN) 150 MG tablet [Pharmacy Med Name: FLUCONAZOLE 150MG  TABLETS] 1 tablet 3    Sig: TAKE 1 TABLET(150 MG) BY MOUTH 1 TIME FOR 1 DOSE     Off-Protocol Failed - 10/10/2021  6:11 PM      Failed - Medication not assigned to a protocol, review manually.      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

## 2021-10-11 ENCOUNTER — Other Ambulatory Visit: Payer: Self-pay | Admitting: Family Medicine

## 2021-10-11 DIAGNOSIS — B3731 Acute candidiasis of vulva and vagina: Secondary | ICD-10-CM

## 2021-10-11 DIAGNOSIS — N946 Dysmenorrhea, unspecified: Secondary | ICD-10-CM

## 2021-10-11 NOTE — Telephone Encounter (Signed)
Requested medications are due for refill today Diflucan was temporary, Advil due  Requested medications are on the active medication list yes  Last refill 09/13/21 Advil, 10/10/21 Diflucan  Last visit 08/24/21  Future visit scheduled no  Notes to clinic Diflucan is not delegated, [place] [facility] labs are greater than 360 days ago. Please assess. Requested Prescriptions  Pending Prescriptions Disp Refills   fluconazole (DIFLUCAN) 150 MG tablet [Pharmacy Med Name: FLUCONAZOLE 150MG  TABLETS] 1 tablet 3    Sig: TAKE 1 TABLET(150 MG) BY MOUTH 1 TIME FOR 1 DOSE     Off-Protocol Failed - 10/11/2021  3:44 AM      Failed - Medication not assigned to a protocol, review manually.      Passed - Valid encounter within last 12 months    Recent Outpatient Visits           1 month ago Urinary tract infection without hematuria, site unspecified   [facility] [place], [place], [place]   2 months ago Urinary tract infection without hematuria, site unspecified   [facility] [place], [place], [place]   7 months ago Vaginal candidiasis   [place], Pease, MD   11 months ago Vaginal discharge   [place], Charlane Ferretti, MD   1 year ago [place], Charlane Ferretti, MD               ibuprofen (ADVIL) 600 MG tablet [Pharmacy Med Name: IBUPROFEN 600MG  TABLETS] 60 tablet 1    Sig: TAKE 1 TABLET(600 MG) BY MOUTH EVERY 12 HOURS AS NEEDED FOR CRAMPING     Analgesics:  NSAIDS Failed - 10/11/2021  3:44 AM      Failed - HGB in normal range and within 360 days    Hemoglobin  Date Value Ref Range Status  11/22/2019 14.2 12.0 - 15.0 g/dL Final  07/02/2018 13.1 11.1 - 15.9 g/dL Final          Passed - Cr in normal range and within 360 days    Creatinine, Ser  Date Value Ref Range Status  07/21/2021 0.80 0.57 - 1.00 mg/dL  Final          Passed - Patient is not pregnant      Passed - Valid encounter within last 12 months    Recent Outpatient Visits           1 month ago Urinary tract infection without hematuria, site unspecified   [facility] [place], [place], [place]   2 months ago Urinary tract infection without hematuria, site unspecified   [facility], [place], [place]   7 months ago Vaginal candidiasis   [place], Charlane Ferretti, MD   11 months ago Vaginal discharge   [place], Enobong, MD   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

## 2021-10-12 ENCOUNTER — Other Ambulatory Visit: Payer: Self-pay | Admitting: Family Medicine

## 2021-10-12 DIAGNOSIS — B3731 Acute candidiasis of vulva and vagina: Secondary | ICD-10-CM

## 2021-10-12 NOTE — Telephone Encounter (Signed)
Requested medication (s) are due for refill today:   Not sure  Requested medication (s) are on the active medication list:   Yes  Future visit scheduled:   No.  seen a mo. Ago for UTI by Thereasa Solo   Last ordered: 09/27/2021 #1, 3 refills.   On 10/05/2021 reported she is not taking it.  Returned for provider to review because there isn't a protocol assigned to this medication.   Requested Prescriptions  Pending Prescriptions Disp Refills   fluconazole (DIFLUCAN) 150 MG tablet [Pharmacy Med Name: FLUCONAZOLE 150MG  TABLETS] 1 tablet 3    Sig: TAKE 1 TABLET(150 MG) BY MOUTH 1 TIME FOR 1 DOSE     Off-Protocol Failed - 10/12/2021  3:43 AM      Failed - Medication not assigned to a protocol, review manually.      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

## 2021-10-17 ENCOUNTER — Other Ambulatory Visit: Payer: Self-pay

## 2021-10-17 ENCOUNTER — Ambulatory Visit: Payer: Medicare Other | Admitting: Physical Therapy

## 2021-10-17 ENCOUNTER — Encounter: Payer: Self-pay | Admitting: Physical Therapy

## 2021-10-17 DIAGNOSIS — R293 Abnormal posture: Secondary | ICD-10-CM

## 2021-10-17 DIAGNOSIS — M6281 Muscle weakness (generalized): Secondary | ICD-10-CM | POA: Diagnosis not present

## 2021-10-17 DIAGNOSIS — R279 Unspecified lack of coordination: Secondary | ICD-10-CM | POA: Diagnosis not present

## 2021-10-17 NOTE — Therapy (Signed)
[facility] @ [place], [place], [address] Phone: [phone]   Fax:  [phone]  Physical Therapy Treatment  Patient Details  Name: Caitlin Banks MRN: [id number] Date of Birth: 03-Jan-1991 Referring Provider (PT): R10.2 (ICD-10-CM) - Vaginal pain   Encounter Date: 10/17/2021   PT End of Session - 10/17/21 1237     Visit Number 3    Date for PT Re-Evaluation 12/09/21    Authorization Type [employer] medicare    PT Start Time 1230    PT Stop Time 1310    PT Time Calculation (min) 40 min    Activity Tolerance Patient tolerated treatment well    Behavior During Therapy WFL for tasks assessed/performed             Past Medical History:  Diagnosis Date   Treacher Collins syndrome     Past Surgical History:  Procedure Laterality Date   EXTERNAL EAR SURGERY      There were no vitals filed for this visit.   Subjective Assessment - 10/17/21 1232     Subjective Pt reports she onw able to hold urine 2-2.5 hours at a time which she is more pleased with, continues no leakage, and fully empty. Pt also no longer having pelvic pain.    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 no limits    How long can you stand comfortably? no limits    How long can you walk comfortably? no limits    Patient Stated Goals to have less frequent urination.    Currently in Pain? No/denies                            Pelvic Floor Special Questions - 10/17/21 0001     Urinary Leakage No    Urinary urgency No    Urinary frequency 2-2.5hours    Fecal incontinence No    Fluid intake increased water intake and decreased juice and soda    Caffeine beverages decreased soda and juice intake    Falling out feeling (prolapse) No    Pelvic Floor Internal Exam deferred at this time as pt denied  all symptoms that brought her PT initially and reports she feels better               [facility] Adult PT Treatment/Exercise - 10/17/21 0001       Exercises   Exercises Knee/Hip;Lumbar      Lumbar Exercises: Seated   Sit to Stand 20 reps    Sit to Stand Limitations no UE, mildly increased pace    Other Seated Lumbar Exercises seated EOM, x10 alt opp arm/leg flexion      Lumbar Exercises: Supine   Clam 20 reps    Clam Limitations red loop    Bridge 10 reps      Lumbar Exercises: Quadruped   Straight Leg Raise 10 reps    Straight Leg Raises Limitations max cues to complete                     PT Education - 10/17/21 1237     Education Details Pt educated to continue bladder schedule and diet changes.    Person(s) Educated Patient    Methods Explanation;Demonstration;Tactile cues;Verbal cues    Comprehension Verbalized understanding;Returned demonstration  PT Short Term Goals - 10/17/21 1245       PT SHORT TERM GOAL #1   Title Pt to report ability to hold urine for 2 hours between voids.    Time 4    Period Weeks    Status Achieved    Target Date 10/06/21      PT SHORT TERM GOAL #2   Title pt to report no urinary leakages for one day    Time 4    Period Weeks    Status Achieved    Target Date 10/06/21               PT Long Term Goals - 10/17/21 1245       PT LONG TERM GOAL #1   Title pt to report no more than 1 urinary leak per month    Time 8    Period Weeks    Status Achieved      PT LONG TERM GOAL #2   Title Pt to report ability to hold urine for 3 hours between voids.    Time 8    Period Weeks    Status Not Met                   Plan - 10/17/21 1238     Clinical Impression Statement Pt presents to clinic reporting no leakage, able to hold urine for 2-2.5 hours without leakage and able to fully empty bladder at each void and also denies all pelvic pain. Pt reports she has been trying to increase time  between voids with good effect, pt does demonstrate increased time for education due to cognitive delays and reports "I just need to go to the bathroom then.", at 2.5 hours. Pt does report she no longer has any of the symptoms that brought her to PT and agrees to DC today. Pt session focused on hip and core exercises to insure no leakage with activity with good effect. Pt tolerated well with minor rest breaks and extra cues to complete with improved technique throughout. Pt met all but one LTG for 3 hours between voids as pt now reporting 2.5 hours. Pt denied additional questionst at end of session and PT went over pt's improvements and asked pt again to insure understanding however pt did confirm no continued symptoms.    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, inplantable auditory devices, developmental delay    Examination-Activity Limitations Continence    Examination-Participation Restrictions Community Activity    Stability/Clinical Decision Making Stable/Uncomplicated    Rehab Potential Good    PT Frequency 1x / week    PT Duration 8 weeks    PT Treatment/Interventions ADLs/Self Care Home Management;Functional mobility training;Therapeutic activities;Therapeutic exercise;Neuromuscular re-education;Manual techniques;Patient/family education;Passive range of motion;Energy conservation;Taping    PT Next Visit Plan go over time between voids    PT Home Exercise Plan urge drill and bladder irritants    Consulted and Agree with Plan of Care Patient             Patient will benefit from skilled therapeutic intervention in order to improve the following deficits and impairments:  Decreased coordination, Decreased endurance, Improper body mechanics, Impaired flexibility, Postural dysfunction, Decreased strength  Visit Diagnosis: Abnormal  posture     Problem List Patient Active Problem List   Diagnosis Date Noted   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    Remaining deficits: Pt denied multiple times when asked during session of continued symptoms. Pt reports she is no longer having any symptoms that brought her PT.   Education / Equipment: HEP   Patient agrees to discharge. Patient goals were partially met. Patient is being discharged due to being pleased with the current functional level. Thank you for referral.    Junie Panning, PT 10/17/2021, 12:59 PM  [facility] @ [place], [place], [address] Phone: [phone]   Fax:  [phone]  Name: HENA EWALT MRN: [id number] Date of Birth: Nov 21, 1991

## 2021-11-07 ENCOUNTER — Encounter: Payer: Medicare Other | Admitting: Physical Therapy

## 2021-11-14 ENCOUNTER — Encounter: Payer: Medicare Other | Admitting: Physical Therapy

## 2021-11-23 IMAGING — US US ART/VEN ABD/PELV/SCROTUM DOPPLER COMPLETE
1 series · 13 of 25 positions shown · non-contrast
Comparison: Pelvic ultrasound 01/02/2012

CLINICAL DATA: Dysmenorrhea, pelvic pain

EXAM:
TRANSABDOMINAL ULTRASOUND OF PELVIS
DOPPLER ULTRASOUND OF OVARIES
TECHNIQUE: Transabdominal ultrasound examination of the pelvis was performed
including evaluation of the uterus, ovaries, adnexal regions, and
pelvic cul-de-sac.
Color and duplex Doppler ultrasound was utilized to evaluate blood
flow to the ovaries.

[Series 1: us art/ven abd/pelv/scrotum doppler complete · 13 of 41 slices shown]
[im 1/41]
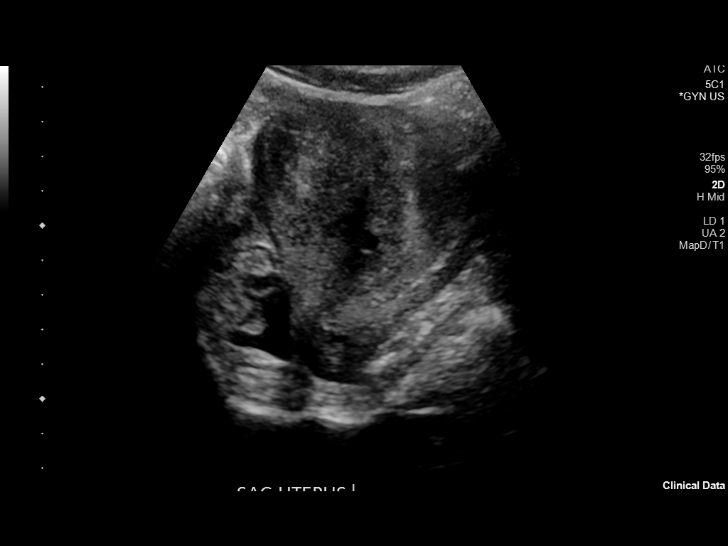
[im 4/41]
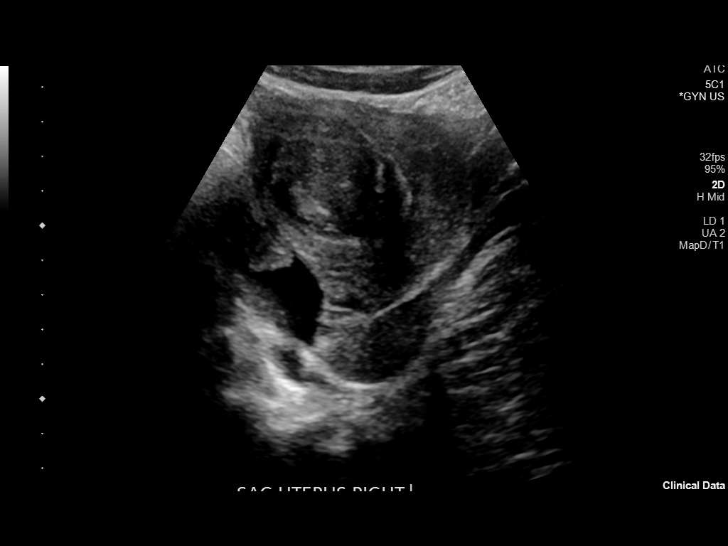
[im 7/41]
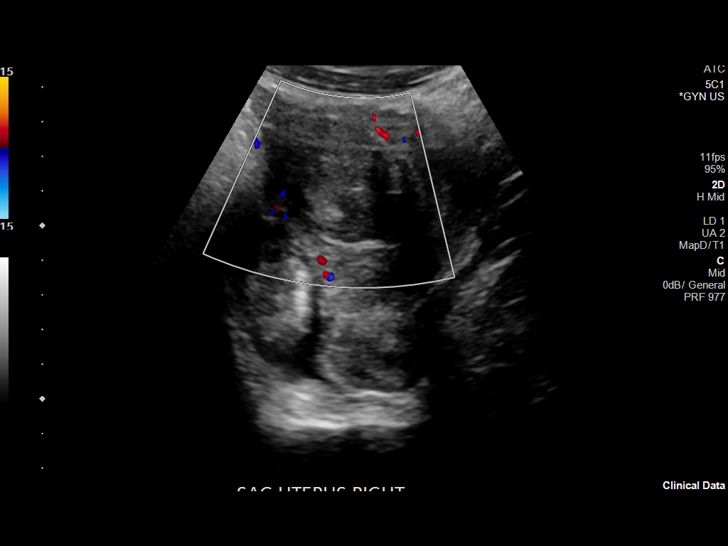
[im 11/41]
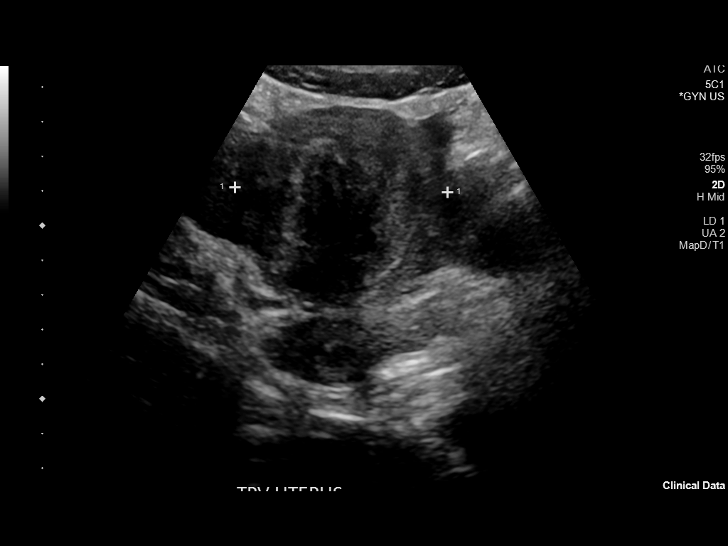
[im 14/41]
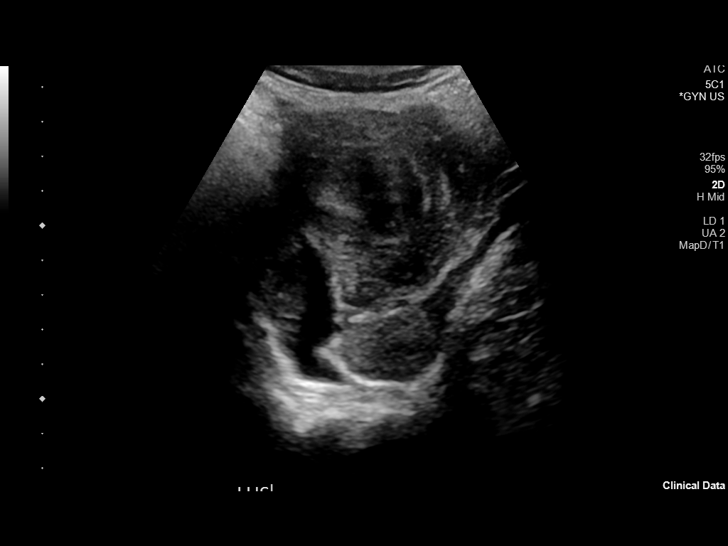
[im 17/41]
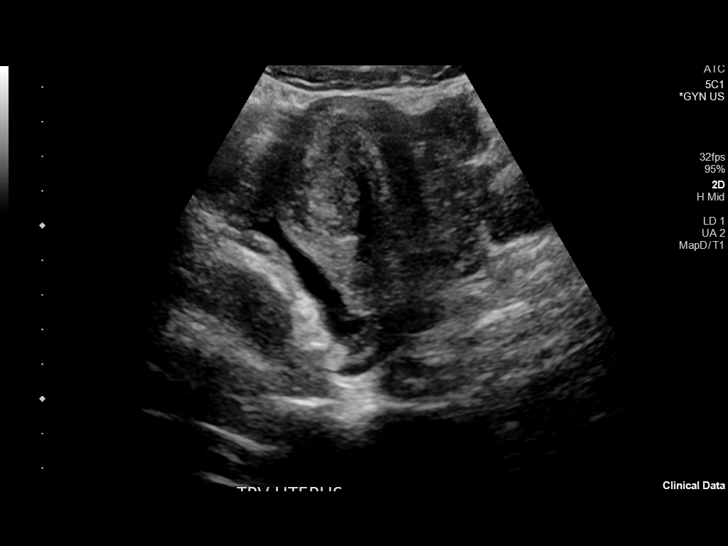
[im 21/41]
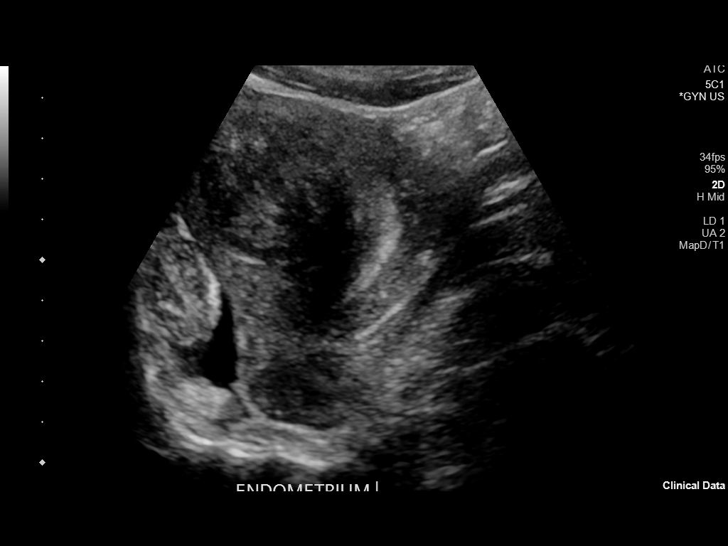
[im 24/41]
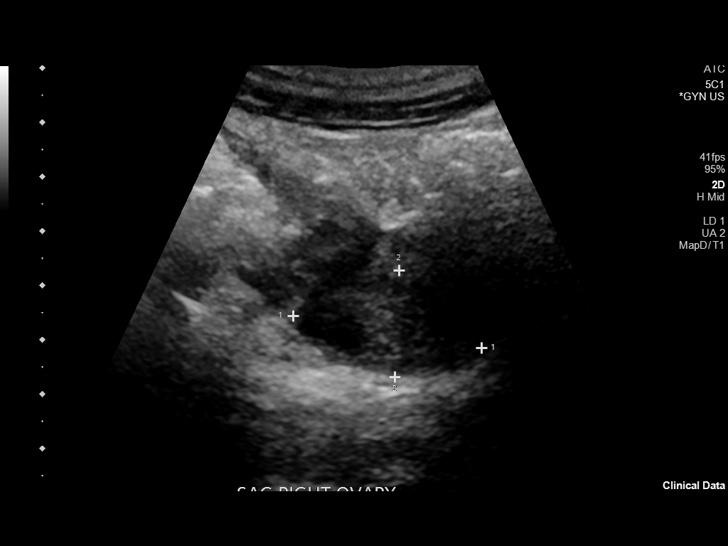
[im 27/41]
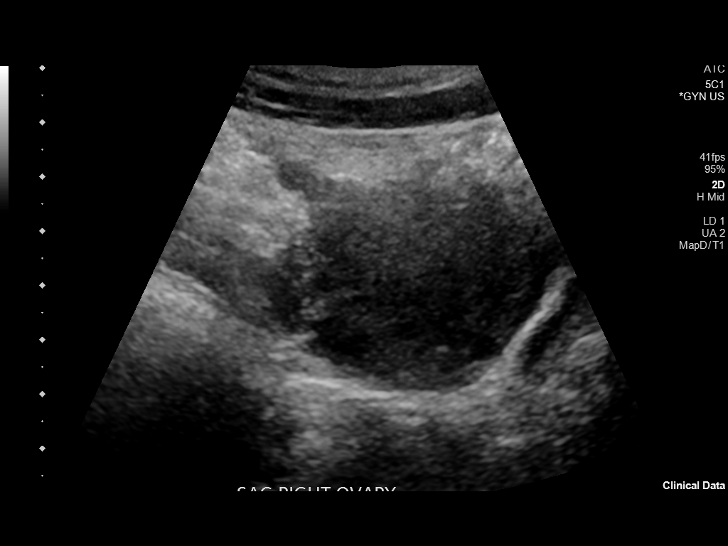
[im 31/41]
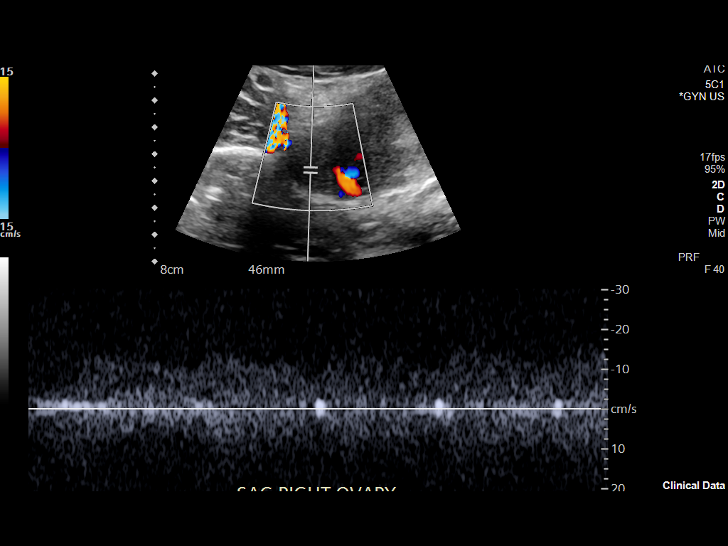
[im 34/41]
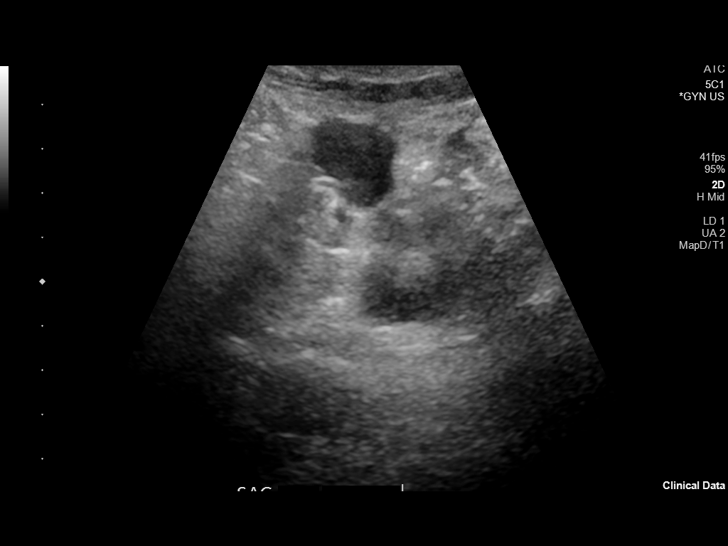
[im 37/41]
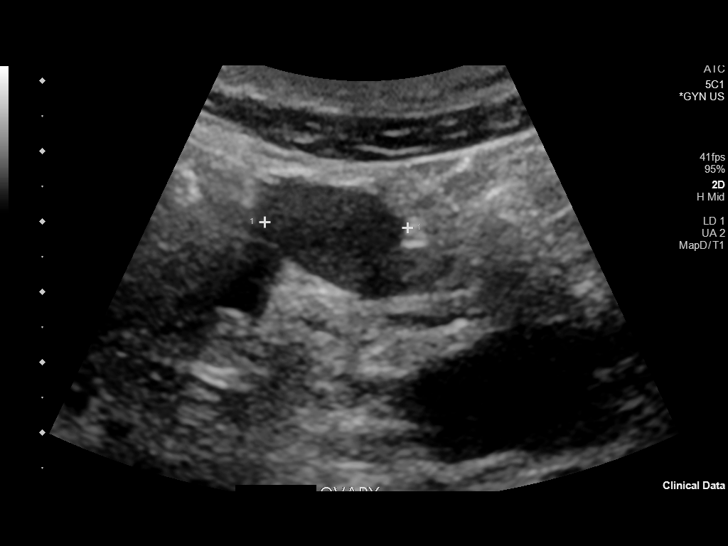
[im 41/41]
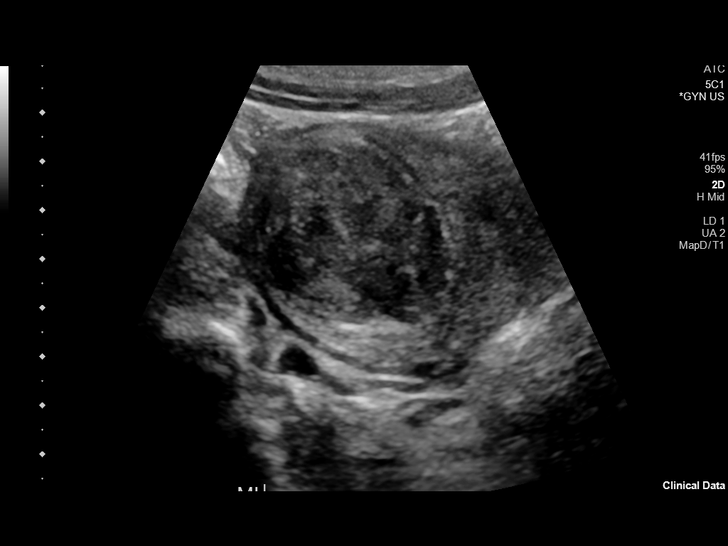

[13 of 25 positions shown; findings below may reference images not displayed]

FINDINGS: Uterus

Measurements: 8.2 x 6.2 x 6.1 cm = volume: 164 mL. Uterus is
anteflexed. Heterogeneous hypoechoic intramural fibroid measuring
4.1 x 4.3 x 3.7 cm. Seen along the posterior uterine fundus with
some shadowing edge artifact.

Endometrium

Thickness: 3.9 mm, non thickened.  No focal abnormality visualized.

Right ovary

Measurements: 3.5 x 2.0 x 2.1 cm = volume: 7.7 mL. Normal
appearance, no adnexal mass.

Left ovary

Measurements: 2.6 x 1.5 x 2.0 cm = volume: 4.3 mL. Normal
appearance, no adnexal mass.

Pulsed Doppler evaluation demonstrates normal low-resistance
arterial and venous waveforms in both ovaries.

Other: Trace anechoic free fluid in the pelvis.
IMPRESSION: 4.3 cm posterior fundal fibroid.

Normal appearance and Doppler evaluation of the ovaries. No evidence
of ovarian torsion.

Trace anechoic free fluid in the pelvis, a nonspecific finding in a
reproductive age female which may be physiologic.

## 2021-12-07 LAB — HM HEPATITIS C SCREENING LAB: HM Hepatitis Screen: NEGATIVE

## 2021-12-15 ENCOUNTER — Other Ambulatory Visit: Payer: Self-pay

## 2022-01-09 ENCOUNTER — Other Ambulatory Visit: Payer: Self-pay | Admitting: Family Medicine

## 2022-01-09 DIAGNOSIS — N946 Dysmenorrhea, unspecified: Secondary | ICD-10-CM

## 2022-01-09 NOTE — Telephone Encounter (Signed)
Medication: ibuprofen (ADVIL) 600 MG tablet [[id number]  Has the patient contacted their pharmacy? YES Advised to contact the office (Agent: If no, request that the patient contact the pharmacy for the refill. If patient does not wish to contact the pharmacy document the reason why and proceed with request.) (Agent: If yes, when and what did the pharmacy advise?)  Preferred Pharmacy (with phone number or street name): [facility] [place] [address] [place] [address] Phone: [phone] Fax: [phone] Hours: Not open 24 hours   Has the patient been seen for an appointment in the last year OR does the patient have an upcoming appointment? YES 08/24/21  Agent: Please be advised that RX refills may take up to 3 business days. We ask that you follow-up with your pharmacy.

## 2022-01-10 MED ORDER — IBUPROFEN 600 MG PO TABS: ORAL_TABLET | ORAL | 0 refills | Status: DC

## 2022-01-10 NOTE — Telephone Encounter (Signed)
Requested medication (s) are due for refill today: Yes ° °Requested medication (s) are on the active medication list: Yes ° °Last refill:  10/13/21 ° °Future visit scheduled: Yes ° °Notes to clinic:  See request. ° ° ° °Requested Prescriptions  °Pending Prescriptions Disp Refills  ° ibuprofen (ADVIL) 600 MG tablet 60 tablet 0  °  ° Analgesics:  NSAIDS Failed - 01/09/2022  3:00 PM  °  °  Failed - Manual Review: Labs are only required if the patient has taken medication for more than 8 weeks.  °  °  Failed - HGB in normal range and within 360 days  °  Hemoglobin  °Date Value Ref Range Status  °11/22/2019 14.2 12.0 - 15.0 g/dL Final  °07/02/2018 13.1 11.1 - 15.9 g/dL Final  °  °  °  °  Failed - PLT in normal range and within 360 days  °  Platelets  °Date Value Ref Range Status  °11/22/2019 346 150 - 400 K/uL Final  °07/02/2018 201 150 - 450 x10E3/uL Final  °  °  °  °  Failed - HCT in normal range and within 360 days  °  HCT  °Date Value Ref Range Status  °11/22/2019 43.7 36.0 - 46.0 % Final  ° °Hematocrit  °Date Value Ref Range Status  °07/02/2018 40.8 34.0 - 46.6 % Final  °  °  °  °  Passed - Cr in normal range and within 360 days  °  Creatinine, Ser  °Date Value Ref Range Status  °07/21/2021 0.80 0.57 - 1.00 mg/dL Final  °  °  °  °  Passed - eGFR is 30 or above and within 360 days  °  GFR calc Af Amer  °Date Value Ref Range Status  °11/08/2020 106 >59 mL/min/1.73 Final  °  Comment:  °  **In accordance with recommendations from the NKF-ASN Task force,** °  [facility] is in the process of updating its eGFR calculation to the °  2021 CKD-EPI creatinine equation that estimates kidney function °  without a race variable. °  ° °GFR calc non Af Amer  °Date Value Ref Range Status  °11/08/2020 92 >59 mL/min/1.73 Final  ° °eGFR  °Date Value Ref Range Status  °07/21/2021 100 >59 mL/min/1.73 Final  °  °  °  °  Passed - Patient is not pregnant  °  °  Passed - Valid encounter within last 12 months  °  Recent Outpatient Visits   ° °       ° 4 months ago Urinary tract infection without hematuria, site unspecified  ° [facility] McCl[place], Angela M, PA-C  ° 5 months ago Urinary tract infection without hematuria, site unspecified  ° [facility] McClung, Angela M, PA-C  ° 11 months ago Vaginal candidiasis  ° [facility] [place], Enobong, MD  ° 1 year ago Vaginal discharge  ° [facility] Newlin, Enobong, MD  ° 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

## 2022-01-18 ENCOUNTER — Ambulatory Visit: Payer: Medicare Other | Admitting: Family Medicine

## 2022-03-14 ENCOUNTER — Other Ambulatory Visit (HOSPITAL_COMMUNITY)
Admission: RE | Admit: 2022-03-14 | Discharge: 2022-03-14 | Disposition: A | Discharge status: Home / Self Care | Payer: Medicare Other | Source: Ambulatory Visit | Attending: Family Medicine | Admitting: Family Medicine

## 2022-03-14 ENCOUNTER — Encounter: Payer: Self-pay | Admitting: Family Medicine

## 2022-03-14 ENCOUNTER — Ambulatory Visit: Payer: Medicare Other | Attending: Family Medicine | Admitting: Family Medicine

## 2022-03-14 VITALS — BP 118/79 | HR 76 | Ht 63.0 in | Wt 133.0 lb

## 2022-03-14 DIAGNOSIS — Z130 Encounter for screening for diseases of the blood and blood-forming organs and certain disorders involving the immune mechanism: Secondary | ICD-10-CM | POA: Diagnosis not present

## 2022-03-14 DIAGNOSIS — Z13228 Encounter for screening for other metabolic disorders: Secondary | ICD-10-CM | POA: Diagnosis not present

## 2022-03-14 DIAGNOSIS — N898 Other specified noninflammatory disorders of vagina: Secondary | ICD-10-CM | POA: Diagnosis present

## 2022-03-14 DIAGNOSIS — R21 Rash and other nonspecific skin eruption: Secondary | ICD-10-CM

## 2022-03-14 NOTE — Progress Notes (Signed)
Vaginal discharge ?Black spots on face. ?

## 2022-03-14 NOTE — Progress Notes (Signed)
? ?Subjective:  ?Patient ID: Caitlin Banks, female    DOB: 1991/09/23  Age: 31 y.o. MRN: [id number] ? ?CC: Vaginal Discharge ? ? ?HPI ?Caitlin Banks is a 31 y.o. 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 ?Interval History: ? ?Complains of brownish vaginal discharge which occurs monthly after her monthly cycles with no associated itching.  Denies presence of urinary symptoms, abdominal pain. ?She is not sexually active. ? ?She complains of a hyperpigmentation on her face and is unsure of the duration. ?In the near future she will be undergoing some dental procedure and is wondering if I have received any correspondence from her dentist which I am yet to receive. ?Past Medical History:  ?Diagnosis Date  ? Treacher Collins syndrome   ? ? ?Past Surgical History:  ?Procedure Laterality Date  ? EXTERNAL EAR SURGERY    ? ? ?No family history on file. ? ?Social History  ? ?Socioeconomic History  ? Marital status: Single  ?  Spouse name: Not on file  ? Number of children: Not on file  ? Years of education: Not on file  ? Highest education level: Not on file  ?Occupational History  ? Not on file  ?Tobacco Use  ? Smoking status: Never  ? Smokeless tobacco: Never  ?Substance and Sexual Activity  ? Alcohol use: No  ? Drug use: No  ? Sexual activity: Not on file  ?Other Topics Concern  ? Not on file  ?Social History Narrative  ? Not on file  ? ?Social Determinants of Health  ? ?Financial Resource Strain: Not on file  ?Food Insecurity: No Food Insecurity  ? Worried About Charity fundraiser in the Last Year: Never true  ? Ran Out of Food in the Last Year: Never true  ?Transportation Needs: No Transportation Needs  ? Lack of Transportation (Medical): No  ? Lack of Transportation (Non-Medical): No  ?Physical Activity: Insufficiently Active  ? Days of Exercise per Week: 4 days  ? Minutes of Exercise per Session: 20  min  ?Stress: No Stress Concern Present  ? Feeling of Stress : Only a little  ?Social Connections: Moderately Isolated  ? Frequency of Communication with Friends and Family: Twice a week  ? Frequency of Social Gatherings with Friends and Family: Twice a week  ? Attends Religious Services: 1 to 4 times per year  ? Active Member of Clubs or Organizations: No  ? Attends Archivist Meetings: Never  ? Marital Status: Never married  ? ? ?No Known Allergies ? ?Outpatient Medications Prior to Visit  ?Medication Sig Dispense Refill  ? acetaminophen (TYLENOL) 500 MG tablet Take 500 mg by mouth every 6 (six) hours as needed for moderate pain.    ? dicyclomine (BENTYL) 20 MG tablet Take 1 tablet (20 mg total) by mouth every 12 (twelve) hours as needed (for abdominal pain/cramping). 20 tablet 0  ? fluconazole (DIFLUCAN) 150 MG tablet TAKE 1 TABLET(150 MG) BY MOUTH 1 TIME FOR 1 DOSE 1 tablet 3  ? fluticasone (FLONASE) 50 MCG/ACT nasal spray Place 2 sprays into both nostrils daily. 16 g 2  ? hydroxypropyl methylcellulose / hypromellose (ISOPTO TEARS / GONIOVISC) 2.5 % ophthalmic solution Place 1 drop into both eyes 3 (three) times daily as needed for dry eyes.    ? ibuprofen (ADVIL) 600 MG tablet TAKE 1 TABLET(600 MG) BY MOUTH EVERY 12 HOURS AS NEEDED FOR  CRAMPING 60 tablet 0  ? lactobacillus acidophilus & bulgar (LACTINEX) chewable tablet Chew 1 tablet by mouth 3 (three) times daily with meals. 30 tablet 0  ? loratadine (CLARITIN) 10 MG tablet Take 1 tablet (10 mg total) by mouth daily. 30 tablet 2  ? mirabegron ER (MYRBETRIQ) 50 MG TB24 tablet Take 50 mg by mouth daily.    ? Multiple Vitamin (MULTIVITAMIN WITH MINERALS) TABS tablet Take 1 tablet by mouth daily.    ? polyethylene glycol powder (GLYCOLAX/MIRALAX) 17 GM/SCOOP powder Take 17 g by mouth daily. 3350 g 1  ? simethicone (GAS-X) 80 MG chewable tablet Chew 1 tablet (80 mg total) by mouth every 8 (eight) hours as needed for flatulence. 120 tablet 1  ? terconazole  (TERAZOL 7) 0.4 % vaginal cream Place 1 applicator vaginally at bedtime. Use for seven days 45 g 0  ? ?No facility-administered medications prior to visit.  ? ? ? ?ROS ?Review of Systems  ?Constitutional:  Negative for activity change and appetite change.  ?HENT:  Negative for sinus pressure and sore throat.   ?Respiratory:  Negative for chest tightness, shortness of breath and wheezing.   ?Cardiovascular:  Negative for chest pain and palpitations.  ?Gastrointestinal:  Negative for abdominal distention, abdominal pain and constipation.  ?Genitourinary:  Positive for vaginal discharge.  ?Musculoskeletal: Negative.   ?Skin:  Positive for rash.  ?Psychiatric/Behavioral:  Negative for behavioral problems and dysphoric mood.   ? ?Objective:  ?BP 118/79   Pulse 76   Ht _0  (1.6 m)   Wt 133 lb (60.3 kg)   SpO2 99%   BMI 23.56 kg/m?  ? ? ?  03/14/2022  ?  3:40 PM 10/05/2021  ?  2:42 PM 08/24/2021  ?  4:02 PM  ?BP/Weight  ?Systolic BP 786 767 209  ?Diastolic BP 79 74 69  ?Wt. (Lbs) 133 131.6 132.2  ?BMI 23.56 kg/m2 23.31 kg/m2 23.42 kg/m2  ? ? ? ? ?Physical Exam ?Constitutional:   ?   Appearance: She is well-developed.  ?Cardiovascular:  ?   Rate and Rhythm: Normal rate.  ?   Heart sounds: Normal heart sounds. No murmur heard. ?Pulmonary:  ?   Effort: Pulmonary effort is normal.  ?   Breath sounds: Normal breath sounds. No wheezing or rales.  ?Chest:  ?   Chest wall: No tenderness.  ?Abdominal:  ?   General: Bowel sounds are normal. There is no distension.  ?   Palpations: Abdomen is soft. There is no mass.  ?   Tenderness: There is no abdominal tenderness.  ?Musculoskeletal:     ?   General: Normal range of motion.  ?   Right lower leg: No edema.  ?   Left lower leg: No edema.  ?Skin: ?   Comments: Nasal bridge and left medial aspect of cheek with large hyperpigmented patch  ?Neurological:  ?   Mental Status: She is alert and oriented to person, place, and time.  ?Psychiatric:     ?   Mood and Affect: Mood normal.   ? ? ? ?  Latest Ref Rng & Units 07/21/2021  ?  3:59 PM 11/08/2020  ?  5:11 PM 11/22/2019  ?  6:06 PM  ?CMP  ?Glucose 65 - 99 mg/dL 99   76   97    ?BUN 6 - 20 mg/dL _1 ?Creatinine 0.57 - 1.00 mg/dL 0.80   0.85   0.84    ?  Sodium 134 - 144 mmol/L 139   139   133    ?Potassium 3.5 - 5.2 mmol/L 4.1   3.9   3.5    ?Chloride 96 - 106 mmol/L 104   105   100    ?CO2 20 - 29 mmol/L _0 ?Calcium 8.7 - 10.2 mg/dL 9.2   9.4   9.5    ?Total Protein 6.0 - 8.5 g/dL 7.2    9.4    ?Total Bilirubin 0.0 - 1.2 mg/dL 0.3    0.9    ?Alkaline Phos 44 - 121 IU/L 43    52    ?AST 0 - 40 IU/L 16    18    ?ALT 0 - 32 IU/L 15    15    ? ? ?Lipid Panel  ?No results found for: CHOL, TRIG, HDL, CHOLHDL, VLDL, LDLCALC, LDLDIRECT ? ?CBC ?   ?Component Value Date/Time  ? WBC 7.7 11/22/2019 1806  ? RBC 5.49 (H) 11/22/2019 1806  ? HGB 14.2 11/22/2019 1806  ? HGB 13.1 07/02/2018 1429  ? HCT 43.7 11/22/2019 1806  ? HCT 40.8 07/02/2018 1429  ? PLT 346 11/22/2019 1806  ? PLT 201 07/02/2018 1429  ? MCV 79.6 (L) 11/22/2019 1806  ? MCV 84 07/02/2018 1429  ? MCH 25.9 (L) 11/22/2019 1806  ? MCHC 32.5 11/22/2019 1806  ? RDW 13.2 11/22/2019 1806  ? RDW 13.8 07/02/2018 1429  ? LYMPHSABS 2.0 07/02/2018 1429  ? EOSABS 0.1 07/02/2018 1429  ? BASOSABS 0.0 07/02/2018 1429  ? ? ?Lab Results  ?Component Value Date  ? HGBA1C 5.0 11/30/2017  ? ? ?Assessment & Plan:  ?1. Vaginal discharge ?We will send off vaginal swab and if it returns with BV she will need to be placed on prophylactic treatment in addition to treating for acute episode ?- Cervicovaginal ancillary only ? ?2. Facial rash ?Unknown etiology of large hyperpigmented facial patch ?- Ambulatory referral to Dermatology ? ?3. Screening for metabolic disorder ?- ZCH88+FOYD ? ?4. Screening for deficiency anemia ?- CBC with Differential/Platelet ? ? ?Health Care Maintenance: Continues to decline Pap smear ?No orders of the defined types were placed in this encounter. ? ? ?Follow-up: Return if  symptoms worsen or fail to improve.  ? ? ? ? ? ?Charlott Rakes, MD, FAAFP. ?[address] ?[place], [place] ?[phone]   ?03/14/2022, 5:38 PM ?

## 2022-03-15 LAB — CERVICOVAGINAL ANCILLARY ONLY
Bacterial Vaginitis (gardnerella): NEGATIVE
Candida Glabrata: NEGATIVE
Candida Vaginitis: NEGATIVE
Chlamydia: NEGATIVE
Comment: NEGATIVE
Comment: NEGATIVE
Comment: NEGATIVE
Comment: NEGATIVE
Comment: NEGATIVE
Comment: NORMAL
Neisseria Gonorrhea: NEGATIVE
Trichomonas: NEGATIVE

## 2022-03-15 LAB — CMP14+EGFR
ALT: 18 IU/L (ref 0–32)
AST: 19 IU/L (ref 0–40)
Albumin/Globulin Ratio: 1.6 (ref 1.2–2.2)
Albumin: 4.6 g/dL (ref 3.8–4.8)
Alkaline Phosphatase: 51 IU/L (ref 44–121)
BUN/Creatinine Ratio: 17 (ref 9–23)
BUN: 14 mg/dL (ref 6–20)
Bilirubin Total: <0.2 mg/dL (ref 0.0–1.2)
CO2: 21 mmol/L (ref 20–29)
Calcium: 9.5 mg/dL (ref 8.7–10.2)
Chloride: 104 mmol/L (ref 96–106)
Creatinine, Ser: 0.82 mg/dL (ref 0.57–1.00)
Globulin, Total: 2.8 g/dL (ref 1.5–4.5)
Glucose: 99 mg/dL (ref 70–99)
Potassium: 4.2 mmol/L (ref 3.5–5.2)
Sodium: 141 mmol/L (ref 134–144)
Total Protein: 7.4 g/dL (ref 6.0–8.5)
eGFR: 97 mL/min/{1.73_m2} (ref >59)

## 2022-03-15 LAB — CBC WITH DIFFERENTIAL/PLATELET
Basophils Absolute: 0 10*3/uL (ref 0.0–0.2)
Basos: 1 %
EOS (ABSOLUTE): 0.1 10*3/uL (ref 0.0–0.4)
Eos: 2 %
Hematocrit: 39 % (ref 34.0–46.6)
Hemoglobin: 12.7 g/dL (ref 11.1–15.9)
Immature Grans (Abs): 0 10*3/uL (ref 0.0–0.1)
Immature Granulocytes: 0 %
Lymphocytes Absolute: 1.8 10*3/uL (ref 0.7–3.1)
Lymphs: 30 %
MCH: 26 pg — ABNORMAL LOW (ref 26.6–33.0)
MCHC: 32.6 g/dL (ref 31.5–35.7)
MCV: 80 fL (ref 79–97)
Monocytes Absolute: 0.4 10*3/uL (ref 0.1–0.9)
Monocytes: 7 %
Neutrophils Absolute: 3.7 10*3/uL (ref 1.4–7.0)
Neutrophils: 60 %
Platelets: 244 10*3/uL (ref 150–450)
RBC: 4.89 x10E6/uL (ref 3.77–5.28)
RDW: 13 % (ref 11.7–15.4)
WBC: 6.2 10*3/uL (ref 3.4–10.8)

## 2022-03-27 DIAGNOSIS — Q898 Other specified congenital malformations: Secondary | ICD-10-CM | POA: Diagnosis not present

## 2022-03-27 DIAGNOSIS — Q172 Microtia: Secondary | ICD-10-CM | POA: Diagnosis not present

## 2022-03-27 DIAGNOSIS — L910 Hypertrophic scar: Secondary | ICD-10-CM | POA: Diagnosis not present

## 2022-03-27 DIAGNOSIS — Q754 Mandibulofacial dysostosis: Secondary | ICD-10-CM | POA: Diagnosis not present

## 2022-03-27 DIAGNOSIS — H906 Mixed conductive and sensorineural hearing loss, bilateral: Secondary | ICD-10-CM | POA: Diagnosis not present

## 2022-03-27 DIAGNOSIS — Z9621 Cochlear implant status: Secondary | ICD-10-CM | POA: Diagnosis not present

## 2022-05-08 NOTE — H&P (Signed)
  Caitlin Banks, Caitlin Banks is a 31 yo who was referred by DDS for extraction of teeth 1, 16, 32.    CC: no pain.  Past Medical History: Sinus Issues, Treacher Collins Syndrome  Medications: Ibuprophen, Gemtesa, multi-vitamin, sleeping aid   Allergies: Aspirin  Surgeries: Nose sx, Ear surgery, Eye surgery  Social:   Tobacco:n  Alcohol: n  Drug use: n  Exam: BMI 22. Erupted teeth # 1, 16, 17. Tooth #17 in occlusion with #15. Impacted tooth #32. No purulence, edema, fluctuance. Class 1 occlusion. Mallampati 1. Oral cancer screening negative. Pharynx clear. no lymphadenopathy   Pan: Erupted teeth # 1, 16, Impacted tooth # 32.    Assessment: ASA  2.  Patient with erupted teeth #1, 16,  impacted tooth 32, no pericoronitis .    Plan: 1. MD Clearance received.  2. Extraction teeth 1, 16,  and 32, IV sedation. Risks and complications discussed.   Consent reviewed. All questions answered. Pre-op instructions given.     Rx: None  Gae Bon, DMD

## 2022-05-10 ENCOUNTER — Encounter (HOSPITAL_COMMUNITY): Payer: Self-pay | Admitting: Oral Surgery

## 2022-05-10 NOTE — Anesthesia Preprocedure Evaluation (Addendum)
Anesthesia Evaluation  Patient identified by MRN, date of birth, ID band Patient awake    Reviewed: Allergy & Precautions, NPO status , Patient's Chart, lab work & pertinent test results  History of Anesthesia Complications (+) PONV and history of anesthetic complications  Airway Mallampati: III  TM Distance: >3 FB Neck ROM: Full  Mouth opening: Limited Mouth Opening  Dental no notable dental hx. (+) Teeth Intact, Dental Advisory Given   Pulmonary neg pulmonary ROS,    Pulmonary exam normal breath sounds clear to auscultation       Cardiovascular negative cardio ROS Normal cardiovascular exam Rhythm:Regular Rate:Normal     Neuro/Psych  Headaches, negative psych ROS   GI/Hepatic negative GI ROS, Neg liver ROS,   Endo/Other  negative endocrine ROS  Renal/GU negative Renal ROS  negative genitourinary   Musculoskeletal negative musculoskeletal ROS (+)   Abdominal   Peds Treacher Collins Syndrome   Hematology negative hematology ROS (+)   Anesthesia Other Findings History includes never smoker, post-operative N/V, Treacher-Collins syndrome (with bilateral facial fat grafting 07/20/17 & 12/19/17), hard of hearing (bone anchored hearing aid 2007 in [place]).   Reproductive/Obstetrics                            Anesthesia Physical Anesthesia Plan  ASA: 3  Anesthesia Plan: General   Post-op Pain Management: Tylenol PO (pre-op)*   Induction: Intravenous  PONV Risk Score and Plan: 4 or greater and Midazolam, Dexamethasone and Ondansetron  Airway Management Planned: Nasal ETT  Additional Equipment:   Intra-op Plan:   Post-operative Plan: Extubation in OR  Informed Consent: I have reviewed the patients History and Physical, chart, labs and discussed the procedure including the risks, benefits and alternatives for the proposed anesthesia with the patient or authorized representative who has  indicated his/her understanding and acceptance.     Dental advisory given  Plan Discussed with: CRNA  Anesthesia Plan Comments: (PAT note written 05/10/2022 by Myra Gianotti, PA-C.  History of Treacher-Collins syndrome. )       Anesthesia Quick Evaluation

## 2022-05-10 NOTE — Progress Notes (Signed)
PCP - Dr Charlott Rakes Cardiologist - n/a  Chest x-ray - n/a EKG - n/a Stress Test - n/a ECHO - n/a Cardiac Cath - n/a  ICD Pacemaker/Loop - n/a  Sleep Study -  n/a CPAP - none  Anesthesia review: Yes  STOP now taking any Aspirin (unless otherwise instructed by your surgeon), Aleve, Naproxen, Ibuprofen, Motrin, Advil, Goody's, BC's, all herbal medications, fish oil, and all vitamins.   Coronavirus Screening Do you have any of the following symptoms:  Cough yes/no: No Fever (>100.8F)  yes/no: No Runny nose yes/no: No Sore throat yes/no: No Difficulty breathing/shortness of breath  yes/no: No  Have you traveled in the last 14 days and where? yes/no: No  Patient verbalized understanding of instructions that were given via phone.

## 2022-05-10 NOTE — Progress Notes (Signed)
Anesthesia Chart Review: Caitlin Banks  Case: [id number] Date/Time: 05/12/22 1000   Procedure: DENTAL RESTORATION/EXTRACTIONS   Anesthesia type: General   Pre-op diagnosis: DENTAL CARIES   Location: [facility] OR ROOM 04 / [facility]   Surgeons: Diona Browner, DMD       DISCUSSION: Patient is a 31 year old female scheduled for the above procedure.  History includes never smoker, post-operative N/V, Treacher-Collins syndrome (with bilateral facial fat grafting 07/20/17 & 12/19/17), hard of hearing (bone anchored hearing aid 2007 in [place]).  Anesthesia records: - 12/19/17 ([facility]): Oral endotracheal airway with ETT and oral RAE. 6.0 ETT. Successful intubation MAC #3, VL - Glidescope. View (Cormack Lehane grade): grade I - visualization of entire laryngeal aperture. - 07/20/17 ([facility]): Smooth IV induction, easy mask (no OA needed), 5.0 oral RAE exchanged for 6.0 oral RAE using CMAC 3. View (Cormack Lehane grade): grade IIb - view of arytenoids or posterior of glottis only. Comments: VL x 1 with CMAC # 3. Grade 2B view. Initially 5.5 oral RAE passed easily and atraumatically through cords, however cuff supraglottic when oral RAE positioned appropriately for surgery with bend of ETT at lips. VL x2 with CMAC #3, grade 2B view, under visualization 5.5 oral RAE removed and 6.0 oral RAE passed easily and atraumatically through cords. Taped at 21 cm at lip. +Chest rise, + EtCO2, and breath sounds heard equally bilaterally.  She is a same day work-up. Medical clearance received per Charlott Rakes, MD. Labs as indicated on the day of surgery. BMET and CBC were unremarkable on 03/14/22.   VS: Ht _0  (1.6 m)   Wt 59.9 kg   LMP 04/27/2022 (Approximate)   BMI 23.38 kg/m   PROVIDERS: Charlott Rakes, MD is PCP who signed a medical clearance letter for planned procedure.  Phillips Hay, MD is ENT ([facility])  LABS: Last results include: Lab Results  Component Value Date   WBC 6.2 03/14/2022   HGB 12.7 03/14/2022    HCT 39.0 03/14/2022   PLT 244 03/14/2022   GLUCOSE 99 03/14/2022   ALT 18 03/14/2022   AST 19 03/14/2022   NA 141 03/14/2022   K 4.2 03/14/2022   CL 104 03/14/2022   CREATININE 0.82 03/14/2022   BUN 14 03/14/2022   CO2 21 03/14/2022    Past Medical History:  Diagnosis Date   Headache    HOH (hard of hearing)    wears hearing aids   Hx of seasonal allergies    PONV (postoperative nausea and vomiting)    Treacher Collins syndrome     Past Surgical History:  Procedure Laterality Date   BILATERAL FACIAL FAT GRAFTING FOR TREACHER COLLINS SYNDROME     x 2 - 05/2017, 11/2017   EXTERNAL EAR SURGERY     KELOID EXCISION  02/20/2020    MEDICATIONS: No current facility-administered medications for this encounter.    acetaminophen (TYLENOL) 500 MG tablet   dicyclomine (BENTYL) 20 MG tablet   fluconazole (DIFLUCAN) 150 MG tablet   fluticasone (FLONASE) 50 MCG/ACT nasal spray   hydroxypropyl methylcellulose / hypromellose (ISOPTO TEARS / GONIOVISC) 2.5 % ophthalmic solution   ibuprofen (ADVIL) 600 MG tablet   lactobacillus acidophilus & bulgar (LACTINEX) chewable tablet   loratadine (CLARITIN) 10 MG tablet   mirabegron ER (MYRBETRIQ) 50 MG TB24 tablet   Multiple Vitamin (MULTIVITAMIN WITH MINERALS) TABS tablet   polyethylene glycol powder (GLYCOLAX/MIRALAX) 17 GM/SCOOP powder   simethicone (GAS-X) 80 MG chewable tablet   terconazole (TERAZOL 7) 0.4 % vaginal cream  Myra Gianotti, PA-C Surgical Short Stay/Anesthesiology [facility] Phone [phone] [facility] Phone [phone] 05/10/2022 11:05 AM

## 2022-05-12 ENCOUNTER — Ambulatory Visit (HOSPITAL_COMMUNITY): Payer: Medicare Other | Admitting: Vascular Surgery

## 2022-05-12 ENCOUNTER — Encounter (HOSPITAL_COMMUNITY)
Admission: RE | Disposition: A | Discharge status: Home / Self Care | Payer: Self-pay | Source: Home / Self Care | Attending: Oral Surgery

## 2022-05-12 ENCOUNTER — Ambulatory Visit (HOSPITAL_BASED_OUTPATIENT_CLINIC_OR_DEPARTMENT_OTHER): Payer: Medicare Other | Admitting: Vascular Surgery

## 2022-05-12 ENCOUNTER — Ambulatory Visit (HOSPITAL_COMMUNITY)
Admission: RE | Admit: 2022-05-12 | Discharge: 2022-05-12 | Disposition: A | Discharge status: Home / Self Care | Payer: Medicare Other | Attending: Oral Surgery | Admitting: Oral Surgery

## 2022-05-12 ENCOUNTER — Other Ambulatory Visit: Payer: Self-pay

## 2022-05-12 ENCOUNTER — Encounter (HOSPITAL_COMMUNITY): Payer: Self-pay | Admitting: Oral Surgery

## 2022-05-12 DIAGNOSIS — Q754 Mandibulofacial dysostosis: Secondary | ICD-10-CM

## 2022-05-12 DIAGNOSIS — K011 Impacted teeth: Secondary | ICD-10-CM

## 2022-05-12 DIAGNOSIS — H9190 Unspecified hearing loss, unspecified ear: Secondary | ICD-10-CM | POA: Insufficient documentation

## 2022-05-12 DIAGNOSIS — K0889 Other specified disorders of teeth and supporting structures: Secondary | ICD-10-CM

## 2022-05-12 HISTORY — DX: Unspecified hearing loss, unspecified ear: H91.90

## 2022-05-12 HISTORY — DX: Allergy status to unspecified drugs, medicaments and biological substances: Z88.9

## 2022-05-12 HISTORY — DX: Other specified postprocedural states: Z98.890

## 2022-05-12 HISTORY — PX: TOOTH EXTRACTION: SHX859

## 2022-05-12 HISTORY — DX: Headache, unspecified: R51.9

## 2022-05-12 HISTORY — DX: Other specified postprocedural states: R11.2

## 2022-05-12 LAB — POCT I-STAT, CHEM 8
BUN: 13 mg/dL (ref 6–20)
Calcium, Ion: 1.22 mmol/L (ref 1.15–1.40)
Chloride: 106 mmol/L (ref 98–111)
Creatinine, Ser: 0.6 mg/dL (ref 0.44–1.00)
Glucose, Bld: 91 mg/dL (ref 70–99)
HCT: 43 % (ref 36.0–46.0)
Hemoglobin: 14.6 g/dL (ref 12.0–15.0)
Potassium: 3.6 mmol/L (ref 3.5–5.1)
Sodium: 141 mmol/L (ref 135–145)
TCO2: 24 mmol/L (ref 22–32)

## 2022-05-12 LAB — POCT PREGNANCY, URINE: Preg Test, Ur: NEGATIVE

## 2022-05-12 SURGERY — DENTAL RESTORATION/EXTRACTIONS
Anesthesia: General

## 2022-05-12 MED ORDER — SODIUM CHLORIDE 0.9 % IR SOLN
Status: DC | PRN
  Administered 2022-05-12: 3000 mL

## 2022-05-12 MED ORDER — PROPOFOL 10 MG/ML IV BOLUS
INTRAVENOUS | Status: AC
  Filled 2022-05-12: qty 20

## 2022-05-12 MED ORDER — ORAL CARE MOUTH RINSE: 15.0000 mL | Freq: Once | OROMUCOSAL | Status: AC

## 2022-05-12 MED ORDER — SCOPOLAMINE 1 MG/3DAYS TD PT72
MEDICATED_PATCH | TRANSDERMAL | Status: DC
Start: 2022-05-12 — End: 2022-05-12
  Administered 2022-05-12: 1.5 mg via TRANSDERMAL
  Filled 2022-05-12: qty 1

## 2022-05-12 MED ORDER — LIDOCAINE-EPINEPHRINE 1 %-1:100000 IJ SOLN
INTRAMUSCULAR | Status: AC
  Filled 2022-05-12: qty 1

## 2022-05-12 MED ORDER — FENTANYL CITRATE (PF) 250 MCG/5ML IJ SOLN
INTRAMUSCULAR | Status: AC
  Filled 2022-05-12: qty 5

## 2022-05-12 MED ORDER — LIDOCAINE-EPINEPHRINE 2 %-1:100000 IJ SOLN
INTRAMUSCULAR | Status: DC | PRN
  Administered 2022-05-12: 20 mL
  Administered 2022-05-12: 10 mL

## 2022-05-12 MED ORDER — SCOPOLAMINE 1 MG/3DAYS TD PT72: 1.0000 | MEDICATED_PATCH | TRANSDERMAL | Status: DC

## 2022-05-12 MED ORDER — HYDROCODONE-ACETAMINOPHEN 5-325 MG PO TABS: 1.0000 | ORAL_TABLET | Freq: Four times a day (QID) | ORAL | 0 refills | Status: DC | PRN

## 2022-05-12 MED ORDER — CHLORHEXIDINE GLUCONATE 0.12 % MT SOLN
15.0000 mL | Freq: Once | OROMUCOSAL | Status: AC
  Administered 2022-05-12: 15 mL via OROMUCOSAL
  Filled 2022-05-12: qty 15

## 2022-05-12 MED ORDER — FENTANYL CITRATE (PF) 100 MCG/2ML IJ SOLN
INTRAMUSCULAR | Status: DC | PRN
  Administered 2022-05-12 (×3): 50 ug via INTRAVENOUS

## 2022-05-12 MED ORDER — MIDAZOLAM HCL 2 MG/2ML IJ SOLN
INTRAMUSCULAR | Status: AC
Start: 2022-05-12 — End: ?
  Filled 2022-05-12: qty 2

## 2022-05-12 MED ORDER — CEFAZOLIN SODIUM-DEXTROSE 2-4 GM/100ML-% IV SOLN
2.0000 g | INTRAVENOUS | Status: AC
  Administered 2022-05-12: 2 g via INTRAVENOUS
  Filled 2022-05-12: qty 100

## 2022-05-12 MED ORDER — SUGAMMADEX SODIUM 200 MG/2ML IV SOLN
INTRAVENOUS | Status: DC | PRN
  Administered 2022-05-12: 120 mg via INTRAVENOUS

## 2022-05-12 MED ORDER — LIDOCAINE-EPINEPHRINE 2 %-1:100000 IJ SOLN
INTRAMUSCULAR | Status: AC
  Filled 2022-05-12: qty 1

## 2022-05-12 MED ORDER — ONDANSETRON HCL 4 MG/2ML IJ SOLN
INTRAMUSCULAR | Status: DC | PRN
  Administered 2022-05-12: 4 mg via INTRAVENOUS

## 2022-05-12 MED ORDER — AMOXICILLIN 500 MG PO CAPS: 500.0000 mg | ORAL_CAPSULE | Freq: Three times a day (TID) | ORAL | 0 refills | Status: DC

## 2022-05-12 MED ORDER — LACTATED RINGERS IV SOLN: INTRAVENOUS | Status: DC

## 2022-05-12 MED ORDER — MIDAZOLAM HCL 2 MG/2ML IJ SOLN
INTRAMUSCULAR | Status: DC | PRN
  Administered 2022-05-12: 2 mg via INTRAVENOUS

## 2022-05-12 MED ORDER — 0.9 % SODIUM CHLORIDE (POUR BTL) OPTIME
TOPICAL | Status: DC | PRN
  Administered 2022-05-12: 1000 mL

## 2022-05-12 MED ORDER — ROCURONIUM BROMIDE 100 MG/10ML IV SOLN
INTRAVENOUS | Status: DC | PRN
  Administered 2022-05-12: 20 mg via INTRAVENOUS
  Administered 2022-05-12: 10 mg via INTRAVENOUS

## 2022-05-12 MED ORDER — DEXAMETHASONE SODIUM PHOSPHATE 10 MG/ML IJ SOLN
INTRAMUSCULAR | Status: DC | PRN
  Administered 2022-05-12: 8 mg via INTRAVENOUS

## 2022-05-12 MED ORDER — LIDOCAINE 2% (20 MG/ML) 5 ML SYRINGE
INTRAMUSCULAR | Status: DC | PRN
  Administered 2022-05-12: 40 mg via INTRAVENOUS

## 2022-05-12 MED ORDER — PROPOFOL 10 MG/ML IV BOLUS
INTRAVENOUS | Status: DC | PRN
  Administered 2022-05-12: 100 mg via INTRAVENOUS

## 2022-05-12 MED ORDER — OXYMETAZOLINE HCL 0.05 % NA SOLN
NASAL | Status: DC | PRN
  Administered 2022-05-12: 2 via NASAL

## 2022-05-12 MED ORDER — SUCCINYLCHOLINE CHLORIDE 200 MG/10ML IV SOSY
PREFILLED_SYRINGE | INTRAVENOUS | Status: DC | PRN
  Administered 2022-05-12: 140 mg via INTRAVENOUS

## 2022-05-12 SURGICAL SUPPLY — 38 items
BAG COUNTER SPONGE SURGICOUNT (BAG) IMPLANT
BLADE SURG 15 STRL LF DISP TIS (BLADE) ×1 IMPLANT
BLADE SURG 15 STRL SS (BLADE) ×1
BUR CROSS CUT FISSURE 1.6 (BURR) ×2 IMPLANT
BUR EGG ELITE 4.0 (BURR) ×1 IMPLANT
CANISTER SUCT 3000ML PPV (MISCELLANEOUS) ×2 IMPLANT
COVER SURGICAL LIGHT HANDLE (MISCELLANEOUS) ×2 IMPLANT
DRAPE U-SHAPE 76X120 STRL (DRAPES) ×2 IMPLANT
GAUZE PACKING FOLDED 2  STR (GAUZE/BANDAGES/DRESSINGS) ×1
GAUZE PACKING FOLDED 2 STR (GAUZE/BANDAGES/DRESSINGS) ×1 IMPLANT
GLOVE BIO SURGEON STRL SZ 6.5 (GLOVE) IMPLANT
GLOVE BIO SURGEON STRL SZ7 (GLOVE) IMPLANT
GLOVE BIO SURGEON STRL SZ8 (GLOVE) ×2 IMPLANT
GLOVE BIOGEL PI IND STRL 6.5 (GLOVE) IMPLANT
GLOVE BIOGEL PI IND STRL 7.0 (GLOVE) IMPLANT
GLOVE BIOGEL PI INDICATOR 6.5 (GLOVE)
GLOVE BIOGEL PI INDICATOR 7.0 (GLOVE)
GOWN STRL REUS W/ TWL LRG LVL3 (GOWN DISPOSABLE) ×1 IMPLANT
GOWN STRL REUS W/ TWL XL LVL3 (GOWN DISPOSABLE) ×1 IMPLANT
GOWN STRL REUS W/TWL LRG LVL3 (GOWN DISPOSABLE) ×1
GOWN STRL REUS W/TWL XL LVL3 (GOWN DISPOSABLE) ×1
IV NS 1000ML (IV SOLUTION) ×1
IV NS 1000ML BAXH (IV SOLUTION) ×1 IMPLANT
KIT BASIN OR (CUSTOM PROCEDURE TRAY) ×2 IMPLANT
KIT TURNOVER KIT B (KITS) ×2 IMPLANT
NDL HYPO 25GX1X1/2 BEV (NEEDLE) ×2 IMPLANT
NEEDLE HYPO 25GX1X1/2 BEV (NEEDLE) ×4 IMPLANT
NS IRRIG 1000ML POUR BTL (IV SOLUTION) ×2 IMPLANT
PAD ARMBOARD 7.5X6 YLW CONV (MISCELLANEOUS) ×2 IMPLANT
SLEEVE IRRIGATION ELITE 7 (MISCELLANEOUS) ×2 IMPLANT
SPIKE FLUID TRANSFER (MISCELLANEOUS) ×2 IMPLANT
SPONGE SURGIFOAM ABS GEL 12-7 (HEMOSTASIS) IMPLANT
SUT CHROMIC 3 0 PS 2 (SUTURE) ×2 IMPLANT
SYR BULB IRRIG 60ML STRL (SYRINGE) ×2 IMPLANT
SYR CONTROL 10ML LL (SYRINGE) ×2 IMPLANT
TRAY ENT MC OR (CUSTOM PROCEDURE TRAY) ×2 IMPLANT
TUBING IRRIGATION (MISCELLANEOUS) ×2 IMPLANT
YANKAUER SUCT BULB TIP NO VENT (SUCTIONS) ×2 IMPLANT

## 2022-05-12 NOTE — Transfer of Care (Signed)
Immediate Anesthesia Transfer of Care Note  Patient: Caitlin Banks  Procedure(s) Performed: DENTAL RESTORATION/EXTRACTIONS  Patient Location: PACU  Anesthesia Type:General  Level of Consciousness: drowsy  Airway & Oxygen Therapy: Patient Spontanous Breathing and Patient connected to nasal cannula oxygen  Post-op Assessment: Report given to RN and Post -op Vital signs reviewed and stable  Post vital signs: Reviewed and stable  Last Vitals:  Vitals Value Taken Time  BP 138/82 05/12/22 1141  Temp    Pulse 88 05/12/22 1142  Resp 17 05/12/22 1142  SpO2 100 % 05/12/22 1142  Vitals shown include unvalidated device data.  Last Pain:  Vitals:   05/12/22 0832  TempSrc:   PainSc: 0-No pain         Complications: No notable events documented.

## 2022-05-12 NOTE — Op Note (Signed)
05/12/2022  11:21 AM  PATIENT:  Caitlin Banks  31 y.o. female  PRE-OPERATIVE DIAGNOSIS:  Impacted tooth #32, Non-restorable teeth # 1, 16.  POST-OPERATIVE DIAGNOSIS:  SAME  PROCEDURE:  Procedure(s): EXTRACTION teeth 1, 16, 32  SURGEON:  Surgeon(s): Diona Browner, DMD  ANESTHESIA:   local and general  EBL:  minimal  DRAINS: none   SPECIMEN:  No Specimen  COUNTS:  YES  PLAN OF CARE: Discharge to home after PACU  PATIENT DISPOSITION:  PACU - hemodynamically stable.   PROCEDURE DETAILS: Dictation # [id number]  Gae Bon, DMD 05/12/2022 11:21 AM

## 2022-05-12 NOTE — Anesthesia Procedure Notes (Signed)
Procedure Name: Intubation Date/Time: 05/12/2022 10:59 AM  Performed by: Ezequiel Kayser, CRNAPre-anesthesia Checklist: Patient identified, Emergency Drugs available, Suction available and Patient being monitored Patient Re-evaluated:Patient Re-evaluated prior to induction Oxygen Delivery Method: Circle System Utilized Preoxygenation: Pre-oxygenation with 100% oxygen Induction Type: IV induction Ventilation: Mask ventilation without difficulty Laryngoscope Size: Glidescope and 3 Grade View: Grade I Nasal Tubes: Nasal Rae, Nasal prep performed and Right Tube size: 6.0 mm Number of attempts: 1 Placement Confirmation: ETT inserted through vocal cords under direct vision, positive ETCO2 and breath sounds checked- equal and bilateral Tube secured with: Tape Dental Injury: Teeth and Oropharynx as per pre-operative assessment  Comments: Anterior pressure needed to advance ETT through cords in glidescope view

## 2022-05-12 NOTE — Op Note (Unsigned)
NAME: Caitlin Banks, FLEIG MEDICAL RECORD NO: [id number] ACCOUNT NO: [id number] DATE OF BIRTH: Apr 20, 1991 FACILITY: [facility] LOCATION: [facility] PHYSICIAN: Gae Bon, DDS  Operative Report   DATE OF PROCEDURE: 05/12/2022  PREOPERATIVE DIAGNOSIS:  Treacher Collins syndrome, impacted tooth #32, nonrestorable teeth #1 and #16.  POSTOPERATIVE DIAGNOSIS:  Treacher Collins syndrome, impacted tooth #32, nonrestorable teeth #1 and #16.  PROCEDURE:  Extraction teeth #1, #16 and #32.  DESCRIPTION OF PROCEDURE:  The patient was taken to the operating room and placed on the table in supine position.  General anesthesia was administered and nasal endotracheal tube was placed and secured.  The eyes were protected and the patient was  draped for surgery.  Timeout was performed.  The posterior pharynx was suctioned and a throat pack was placed.  2% lidocaine 1:100,000 epinephrine was infiltrated buccally and palatally around tooth #16 and then around tooth #1 and then inferior alveolar  nerve block was administered on the right side to anesthetize tooth #32.  A bite block was placed on the right side of the mouth.  A sweetheart retractor was used to retract the tongue.  A #15 blade was used to make an incision around tooth #16.  The  periosteum was reflected.  The tooth was elevated with 301 elevator and the tooth was removed from the mouth with the upper Universal forceps.  The sockets were curetted.  No suture was necessary, it was irrigated and then the bite block was repositioned  to the left side of the mouth for access to the right side.  The 15 blade was used to make an incision around tooth #1 buccally and palatally in the gingival sulcus and around tooth #32 in a hockey stick incision.  The periosteum was reflected to expose  the bone around tooth #32.  The Stryker handpiece was used to remove bone buccally and distally.  The tooth was elevated somewhat and then was sectioned into pieces to remove the  roots individually, tooth #1 was elevated with 301 elevator and removed  from the mouth with the dental forceps.  Then, the 2 sockets were curetted, irrigated, tooth #32 required closure with 3-0 chromic.  Tooth #1 did not require closure.  Then, the oral cavity was irrigated and suctioned and a throat pack was removed.  The  patient was left under care of anesthesia for extubation and transport to recovery room with plans for discharge home through day surgery.  ESTIMATED BLOOD LOSS:  Minimal.  COMPLICATIONS:  None.  SPECIMENS:  None.   SHY D: 05/12/2022 11:24:04 am T: 05/12/2022 12:20:00 pm  JOB: [id number]/ [id number]

## 2022-05-12 NOTE — Anesthesia Postprocedure Evaluation (Signed)
Anesthesia Post Note  Patient: Caitlin Banks  Procedure(s) Performed: DENTAL RESTORATION/EXTRACTIONS     Patient location during evaluation: PACU Anesthesia Type: General Level of consciousness: awake and alert Pain management: pain level controlled Vital Signs Assessment: post-procedure vital signs reviewed and stable Respiratory status: spontaneous breathing, nonlabored ventilation, respiratory function stable and patient connected to nasal cannula oxygen Cardiovascular status: blood pressure returned to baseline and stable Postop Assessment: no apparent nausea or vomiting Anesthetic complications: no   No notable events documented.  Last Vitals:  Vitals:   05/12/22 1156 05/12/22 1211  BP: (!) 144/100 (!) 141/99  Pulse: 89 77  Resp: 17 16  Temp:  36.6 C  SpO2: 96% 95%    Last Pain:  Vitals:   05/12/22 1211  TempSrc:   PainSc: 0-No pain                 Duanne Duchesne L Breyana Follansbee

## 2022-05-12 NOTE — H&P (Signed)
H&P documentation  -History and Physical Reviewed  -Patient has been re-examined  -No change in the plan of care  Caitlin Banks  

## 2022-05-13 ENCOUNTER — Encounter (HOSPITAL_COMMUNITY): Payer: Self-pay | Admitting: Oral Surgery

## 2022-08-01 DIAGNOSIS — R3915 Urgency of urination: Secondary | ICD-10-CM | POA: Diagnosis not present

## 2022-08-08 DIAGNOSIS — L2089 Other atopic dermatitis: Secondary | ICD-10-CM | POA: Diagnosis not present

## 2022-08-09 ENCOUNTER — Ambulatory Visit: Payer: Self-pay | Admitting: *Deleted

## 2022-08-09 NOTE — Telephone Encounter (Signed)
Patient called, left VM to return the call to the office to speak to a nurse.    Summary: Back pain, no appt until december   Pt called and reported back pain, no appt until December. [phone]

## 2022-08-09 NOTE — Telephone Encounter (Signed)
  Chief Complaint: back pain Symptoms: 2-3 days Frequency: most of time Pertinent Negatives: Patient denies fever, injury Disposition: '[]'$ ED /'[]'$ Urgent Care (no appt availability in office) / '[x]'$ Appointment(In office/virtual)/ '[]'$  [facility]/ '[x]'$ Home Care/ '[]'$ Refused Recommended Disposition /'[]'$ [facility]/ '[]'$  Follow-up with PCP Additional Notes: Office appt but not until 08/29/22. Home care advice given to use until appt.   Reason for Disposition  [1] MODERATE back pain (e.g., interferes with normal activities) AND [2] present > 3 days  Answer Assessment - Initial Assessment Questions 1. ONSET: "When did the pain begin?"      2 days ago 2. LOCATION: "Where does it hurt?" (upper, mid or lower back)     Lower back 3. SEVERITY: "How bad is the pain?"  (e.g., Scale 1-10; mild, moderate, or severe)   - MILD (1-3): Doesn't interfere with normal activities.    - MODERATE (4-7): Interferes with normal activities or awakens from sleep.    - SEVERE (8-10): Excruciating pain, unable to do any normal activities.      8 4. PATTERN: "Is the pain constant?" (e.g., yes, no; constant, intermittent)      Comes and goes 5. RADIATION: "Does the pain shoot into your legs or somewhere else?"     no 6. CAUSE:  "What do you think is causing the back pain?"      unknown 7. BACK OVERUSE:  "Any recent lifting of heavy objects, strenuous work or exercise?"     no 8. MEDICINES: "What have you taken so far for the pain?" (e.g., nothing, acetaminophen, NSAIDS)     No, some rub 9. NEUROLOGIC SYMPTOMS: "Do you have any weakness, numbness, or problems with bowel/bladder control?"     no 10. OTHER SYMPTOMS: "Do you have any other symptoms?" (e.g., fever, abdomen pain, burning with urination, blood in urine)       no 11. PREGNANCY: "Is there any chance you are pregnant?" "When was your last menstrual period?"       no  Protocols used: Back Pain-A-AH

## 2022-08-09 NOTE — Telephone Encounter (Signed)
Pt called for: Summary: Back pain, no appt until december   Pt called and reported back pain, no appt until December. [phone]      Voicemail left for pt to return call.

## 2022-08-29 ENCOUNTER — Encounter: Payer: Self-pay | Admitting: Family Medicine

## 2022-08-29 ENCOUNTER — Ambulatory Visit: Payer: Medicare Other | Attending: Family Medicine | Admitting: Family Medicine

## 2022-08-29 ENCOUNTER — Other Ambulatory Visit (HOSPITAL_COMMUNITY)
Admission: RE | Admit: 2022-08-29 | Discharge: 2022-08-29 | Disposition: A | Discharge status: Home / Self Care | Payer: Medicare Other | Source: Ambulatory Visit | Attending: Family Medicine | Admitting: Family Medicine

## 2022-08-29 VITALS — BP 120/82 | HR 78 | Temp 98.4°F | Ht 63.0 in | Wt 129.4 lb

## 2022-08-29 DIAGNOSIS — N946 Dysmenorrhea, unspecified: Secondary | ICD-10-CM | POA: Diagnosis not present

## 2022-08-29 DIAGNOSIS — N898 Other specified noninflammatory disorders of vagina: Secondary | ICD-10-CM | POA: Insufficient documentation

## 2022-08-29 DIAGNOSIS — Z23 Encounter for immunization: Secondary | ICD-10-CM | POA: Diagnosis not present

## 2022-08-29 MED ORDER — IBUPROFEN 600 MG PO TABS: ORAL_TABLET | ORAL | 2 refills | Status: DC

## 2022-08-29 NOTE — Progress Notes (Signed)
Subjective:  Patient ID: Caitlin Banks, female    DOB: 09/28/1991  Age: 31 y.o. MRN: [id number]  CC: Follow-up   HPI MARIANELA MANDRELL is a 31 y.o. 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 She will need a refill of Ibuprofen to use when she has pain.  Today she Complains of brownish discharge but no dysuria, itching and discharge and she is not sexually active. LMP was 08/09/22  Last saw her ENT specialist in 03/2022 Past Medical History:  Diagnosis Date   Headache    HOH (hard of hearing)    wears hearing aids   Hx of seasonal allergies    PONV (postoperative nausea and vomiting)    Treacher Collins syndrome     Past Surgical History:  Procedure Laterality Date   BILATERAL FACIAL FAT GRAFTING FOR TREACHER COLLINS SYNDROME     x 2 - 05/2017, 11/2017   EXTERNAL EAR SURGERY     KELOID EXCISION  02/20/2020   TOOTH EXTRACTION N/A 05/12/2022   Procedure: DENTAL RESTORATION/EXTRACTIONS;  Surgeon: Diona Browner, DMD;  Location: [facility];  Service: Oral Surgery;  Laterality: N/A;    History reviewed. No pertinent family history.  Social History   Socioeconomic History   Marital status: Single    Spouse name: Not on file   Number of children: Not on file   Years of education: Not on file   Highest education level: Not on file  Occupational History   Not on file  Tobacco Use   Smoking status: Never   Smokeless tobacco: Never  Vaping Use   Vaping Use: Never used  Substance and Sexual Activity   Alcohol use: No   Drug use: No   Sexual activity: Not Currently    Birth control/protection: None  Other Topics Concern   Not on file  Social History Narrative   Not on file   Social Determinants of Health   Financial Resource Strain: Not on file  Food Insecurity: No Food  Insecurity (10/05/2021)   Hunger Vital Sign    Worried About Running Out of Food in the Last Year: Never true    Ran Out of Food in the Last Year: Never true  Transportation Needs: No Transportation Needs (10/05/2021)   PRAPARE - Hydrologist (Medical): No    Lack of Transportation (Non-Medical): No  Physical Activity: Insufficiently Active (10/05/2021)   Exercise Vital Sign    Days of Exercise per Week: 4 days    Minutes of Exercise per Session: 20 min  Stress: No Stress Concern Present (10/05/2021)   [place]    Feeling of Stress : Only a little  Social Connections: Moderately Isolated (10/05/2021)   Social Connection and Isolation Panel [NHANES]    Frequency of Communication with Friends and Family: Twice a week    Frequency of Social Gatherings with Friends and Family: Twice a week    Attends Religious Services: 1 to 4 times per year    Active Member of [employer] or Organizations: No    Attends Archivist Meetings: Never    Marital Status: Never married    No Known Allergies  Outpatient Medications Prior to Visit  Medication Sig Dispense Refill   acetaminophen (TYLENOL) 500 MG tablet Take 500  mg by mouth every 6 (six) hours as needed for moderate pain.     FIBER ADULT GUMMIES PO Take 2 tablets by mouth daily. Metamucil Gummies     GEMTESA 75 MG TABS Take 75 mg by mouth daily.     HYDROcodone-acetaminophen (NORCO) 5-325 MG tablet Take 1 tablet by mouth every 6 (six) hours as needed for moderate pain. 10 tablet 0   hydroxypropyl methylcellulose / hypromellose (ISOPTO TEARS / GONIOVISC) 2.5 % ophthalmic solution Place 1 drop into both eyes 3 (three) times daily as needed for dry eyes.     melatonin 5 MG TABS Take 5 mg by mouth at bedtime as needed (sleep).     Multiple Vitamin (MULTIVITAMIN WITH MINERALS) TABS tablet Take 1 tablet by mouth daily.     mupirocin ointment (BACTROBAN) 2 %  Apply 1 Application topically daily as needed (nose).     Phenyleph-Doxylamine-DM-APAP (NYQUIL SEVERE COLD/FLU) 5-6.25-10-325 MG/15ML LIQD Take 15 mLs by mouth every 6 (six) hours as needed (cold symptoms).     simethicone (GAS-X) 80 MG chewable tablet Chew 1 tablet (80 mg total) by mouth every 8 (eight) hours as needed for flatulence. 120 tablet 1   fluticasone (FLONASE) 50 MCG/ACT nasal spray Place 2 sprays into both nostrils daily. (Patient taking differently: Place 2 sprays into both nostrils daily as needed for allergies.) 16 g 2   ibuprofen (ADVIL) 600 MG tablet TAKE 1 TABLET(600 MG) BY MOUTH EVERY 12 HOURS AS NEEDED FOR CRAMPING 60 tablet 0   Chromium Picolinate 1000 MCG TABS Take 1,000 mcg by mouth daily. (Patient not taking: Reported on 08/29/2022)     amoxicillin (AMOXIL) 500 MG capsule Take 1 capsule (500 mg total) by mouth 3 (three) times daily. (Patient not taking: Reported on 08/29/2022) 21 capsule 0   chlorpheniramine (CHLOR-TRIMETON) 4 MG tablet Take 4 mg by mouth 2 (two) times daily as needed for allergies. (Patient not taking: Reported on 08/29/2022)     No facility-administered medications prior to visit.     ROS Review of Systems  Constitutional:  Negative for activity change and appetite change.  HENT:  Negative for sinus pressure and sore throat.   Respiratory:  Negative for chest tightness, shortness of breath and wheezing.   Cardiovascular:  Negative for chest pain and palpitations.  Gastrointestinal:  Negative for abdominal distention, abdominal pain and constipation.  Genitourinary: Negative.   Musculoskeletal: Negative.   Psychiatric/Behavioral:  Negative for behavioral problems and dysphoric mood.     Objective:  BP 120/82   Pulse 78   Temp 98.4 F (36.9 C) (Oral)   Ht '5\' 3"'$  (1.6 m)   Wt 129 lb 6.4 oz (58.7 kg)   SpO2 99%   BMI 22.92 kg/m      08/29/2022   11:29 AM 05/12/2022   12:11 PM 05/12/2022   11:56 AM  BP/Weight  Systolic BP 803 212 248   Diastolic BP 82 99 250  Wt. (Lbs) 129.4    BMI 22.92 kg/m2        Physical Exam Constitutional:      Appearance: She is well-developed.  Cardiovascular:     Rate and Rhythm: Normal rate.     Heart sounds: Normal heart sounds. No murmur heard. Pulmonary:     Effort: Pulmonary effort is normal.     Breath sounds: Normal breath sounds. No wheezing or rales.  Chest:     Chest wall: No tenderness.  Abdominal:     General: Bowel sounds are normal. There  is no distension.     Palpations: Abdomen is soft. There is no mass.     Tenderness: There is no abdominal tenderness.  Musculoskeletal:     Right lower leg: No edema.     Left lower leg: No edema.     Comments: Kyphoscoliosis of the thoracic spine  Neurological:     Mental Status: She is alert and oriented to person, place, and time.  Psychiatric:        Mood and Affect: Mood normal.        Latest Ref Rng & Units 05/12/2022    8:59 AM 03/14/2022    4:38 PM 07/21/2021    3:59 PM  CMP  Glucose 70 - 99 mg/dL 91  99  99   BUN 6 - 20 mg/dL '13  14  11   '$ Creatinine 0.44 - 1.00 mg/dL 0.60  0.82  0.80   Sodium 135 - 145 mmol/L 141  141  139   Potassium 3.5 - 5.1 mmol/L 3.6  4.2  4.1   Chloride 98 - 111 mmol/L 106  104  104   CO2 20 - 29 mmol/L  21  22   Calcium 8.7 - 10.2 mg/dL  9.5  9.2   Total Protein 6.0 - 8.5 g/dL  7.4  7.2   Total Bilirubin 0.0 - 1.2 mg/dL  <0.2  0.3   Alkaline Phos 44 - 121 IU/L  51  43   AST 0 - 40 IU/L  19  16   ALT 0 - 32 IU/L  18  15     Lipid Panel  No results found for: "CHOL", "TRIG", "HDL", "CHOLHDL", "VLDL", "LDLCALC", "LDLDIRECT"  CBC    Component Value Date/Time   WBC 6.2 03/14/2022 1638   WBC 7.7 11/22/2019 1806   RBC 4.89 03/14/2022 1638   RBC 5.49 (H) 11/22/2019 1806   HGB 14.6 05/12/2022 0859   HGB 12.7 03/14/2022 1638   HCT 43.0 05/12/2022 0859   HCT 39.0 03/14/2022 1638   PLT 244 03/14/2022 1638   MCV 80 03/14/2022 1638   MCH 26.0 (L) 03/14/2022 1638   MCH 25.9 (L)  11/22/2019 1806   MCHC 32.6 03/14/2022 1638   MCHC 32.5 11/22/2019 1806   RDW 13.0 03/14/2022 1638   LYMPHSABS 1.8 03/14/2022 1638   EOSABS 0.1 03/14/2022 1638   BASOSABS 0.0 03/14/2022 1638    Lab Results  Component Value Date   HGBA1C 5.0 11/30/2017    Assessment & Plan:  1. Vaginal discharge Discussed physiology of the menstrual cycle and the fact that during ovulation it is not uncommon to have some vaginal discharge We will evaluate for presence of bacterial or candidal infection - Cervicovaginal ancillary only  2. Dysmenorrhea - ibuprofen (ADVIL) 600 MG tablet; TAKE 1 TABLET(600 MG) BY MOUTH EVERY 12 HOURS AS NEEDED FOR CRAMPING  Dispense: 60 tablet; Refill: 2  Health care maintenance - flu shot administered, she continues to decline Pap smear. Meds ordered this encounter  Medications   ibuprofen (ADVIL) 600 MG tablet    Sig: TAKE 1 TABLET(600 MG) BY MOUTH EVERY 12 HOURS AS NEEDED FOR CRAMPING    Dispense:  60 tablet    Refill:  2    Follow-up: Return in about 1 year (around 08/30/2023) for Chronic medical conditions.       Charlott Rakes, MD, FAAFP. [facility] [place], [place]   08/29/2022, 12:14 PM

## 2022-08-29 NOTE — Patient Instructions (Signed)

## 2022-08-30 ENCOUNTER — Other Ambulatory Visit: Payer: Self-pay | Admitting: Family Medicine

## 2022-08-30 LAB — CERVICOVAGINAL ANCILLARY ONLY
Bacterial Vaginitis (gardnerella): POSITIVE — AB
Candida Glabrata: NEGATIVE
Candida Vaginitis: NEGATIVE
Chlamydia: NEGATIVE
Comment: NEGATIVE
Comment: NEGATIVE
Comment: NEGATIVE
Comment: NEGATIVE
Comment: NEGATIVE
Comment: NORMAL
Neisseria Gonorrhea: NEGATIVE
Trichomonas: NEGATIVE

## 2022-08-30 MED ORDER — METRONIDAZOLE 500 MG PO TABS: 500.0000 mg | ORAL_TABLET | Freq: Two times a day (BID) | ORAL | 0 refills | Status: AC

## 2022-11-13 ENCOUNTER — Ambulatory Visit: Payer: Medicare Other | Admitting: Family Medicine

## 2022-12-22 DIAGNOSIS — L309 Dermatitis, unspecified: Secondary | ICD-10-CM | POA: Diagnosis not present

## 2022-12-22 DIAGNOSIS — L700 Acne vulgaris: Secondary | ICD-10-CM | POA: Diagnosis not present

## 2023-03-12 ENCOUNTER — Telehealth: Payer: Self-pay | Admitting: Family Medicine

## 2023-03-12 NOTE — Telephone Encounter (Signed)
Contacted Caitlin Banks to schedule their annual wellness visit. Appointment made for 03/21/23.  Caitlin Banks AWV direct phone # [phone]

## 2023-03-21 ENCOUNTER — Ambulatory Visit: Payer: 59 | Attending: Family Medicine

## 2023-03-21 VITALS — Ht 63.0 in | Wt 120.0 lb

## 2023-03-21 DIAGNOSIS — Z0000 Encounter for general adult medical examination without abnormal findings: Secondary | ICD-10-CM | POA: Diagnosis not present

## 2023-03-21 NOTE — Patient Instructions (Signed)
Caitlin Banks , Thank you for taking time to come for your Medicare Wellness Visit. I appreciate your ongoing commitment to your health goals. Please review the following plan we discussed and let me know if I can assist you in the future.   These are the goals we discussed:  Goals      Patient Stated     03/21/2023, drink more water and eat fruits and vegetables        This is a list of the screening recommended for you and due dates:  Health Maintenance  Topic Date Due   Pap Smear  Never done   COVID-19 Vaccine (3 - 2023-24 season) 07/28/2022   Flu Shot  06/28/2023   Medicare Annual Wellness Visit  03/20/2024   DTaP/Tdap/Td vaccine (2 - Td or Tdap) 03/13/2027   Hepatitis C Screening: USPSTF Recommendation to screen - Ages 18-79 yo.  Completed   HIV Screening  Completed   HPV Vaccine  Aged Out    Advanced directives: Advance directive discussed with you today.   Conditions/risks identified: none  Next appointment: Follow up in one year for your annual wellness visit.   Preventive Care 33-40 Years Old, Female Preventive care refers to lifestyle choices and visits with your health care provider that can promote health and wellness. Preventive care visits are also called wellness exams. What can I expect for my preventive care visit? Counseling During your preventive care visit, your health care provider may ask about your: Medical history, including: Past medical problems. Family medical history. Pregnancy history. Current health, including: Menstrual cycle. Method of birth control. Emotional well-being. Home life and relationship well-being. Sexual activity and sexual health. Lifestyle, including: Alcohol, nicotine or tobacco, and drug use. Access to firearms. Diet, exercise, and sleep habits. Work and work Astronomer. Sunscreen use. Safety issues such as seatbelt and bike helmet use. Physical exam Your health care provider may check your: Height and weight.  These may be used to calculate your BMI (body mass index). BMI is a measurement that tells if you are at a healthy weight. Waist circumference. This measures the distance around your waistline. This measurement also tells if you are at a healthy weight and may help predict your risk of certain diseases, such as type 2 diabetes and high blood pressure. Heart rate and blood pressure. Body temperature. Skin for abnormal spots. What immunizations do I need? Vaccines are usually given at various ages, according to a schedule. Your health care provider will recommend vaccines for you based on your age, medical history, and lifestyle or other factors, such as travel or where you work. What tests do I need? Screening Your health care provider may recommend screening tests for certain conditions. This may include: Pelvic exam and Pap test. Lipid and cholesterol levels. Diabetes screening. This is done by checking your blood sugar (glucose) after you have not eaten for a while (fasting). Hepatitis B test. Hepatitis C test. HIV (human immunodeficiency virus) test. STI (sexually transmitted infection) testing, if you are at risk. BRCA-related cancer screening. This may be done if you have a family history of breast, ovarian, tubal, or peritoneal cancers. Talk with your health care provider about your test results, treatment options, and if necessary, the need for more tests. Follow these instructions at home: Eating and drinking  Eat a healthy diet that includes fresh fruits and vegetables, whole grains, lean protein, and low-fat dairy products. Take vitamin and mineral supplements as recommended by your health care provider. Do not drink  alcohol if: Your health care provider tells you not to drink. You are pregnant, may be pregnant, or are planning to become pregnant. If you drink alcohol: Limit how much you have to 0-1 drink a day. Know how much alcohol is in your drink. In the [place] one drink  equals one 12 oz bottle of beer (355 mL), one 5 oz glass of wine (148 mL), or one 1 oz glass of hard liquor (44 mL). Lifestyle Brush your teeth every morning and night with fluoride toothpaste. Floss one time each day. Exercise for at least 30 minutes 5 or more days each week. Do not use any products that contain nicotine or tobacco. These products include cigarettes, chewing tobacco, and vaping devices, such as e-cigarettes. If you need help quitting, ask your health care provider. Do not use drugs. If you are sexually active, practice safe sex. Use a condom or other form of protection to prevent STIs. If you do not wish to become pregnant, use a form of birth control. If you plan to become pregnant, see your health care provider for a prepregnancy visit. Find healthy ways to manage stress, such as: Meditation, yoga, or listening to music. Journaling. Talking to a trusted person. Spending time with friends and family. Minimize exposure to UV radiation to reduce your risk of skin cancer. Safety Always wear your seat belt while driving or riding in a vehicle. Do not drive: If you have been drinking alcohol. Do not ride with someone who has been drinking. If you have been using any mind-altering substances or drugs. While texting. When you are tired or distracted. Wear a helmet and other protective equipment during sports activities. If you have firearms in your house, make sure you follow all gun safety procedures. Seek help if you have been physically or sexually abused. What's next? Go to your health care provider once a year for an annual wellness visit. Ask your health care provider how often you should have your eyes and teeth checked. Stay up to date on all vaccines. This information is not intended to replace advice given to you by your health care provider. Make sure you discuss any questions you have with your health care provider. Document Revised: 05/11/2021 Document Reviewed:  05/11/2021 Elsevier Patient Education  2022 [employer].

## 2023-03-21 NOTE — Progress Notes (Signed)
I connected with  Caitlin Banks on 03/21/23 by a audio enabled telemedicine application and verified that I am speaking with the correct person using two identifiers.  Patient Location: Home  Provider Location: Office/Clinic  I discussed the limitations of evaluation and management by telemedicine. The patient expressed understanding and agreed to proceed.  Subjective:   Caitlin Banks is a 32 y.o. female who presents for an Initial Medicare Annual Wellness Visit.  Review of Systems     Cardiac Risk Factors include: none     Objective:    Today's Vitals   03/21/23 1457  Weight: 120 lb (54.4 kg)  Height: 5\' 3"  (1.6 m)   Body mass index is 21.26 kg/m.     03/21/2023    3:04 PM 09/08/2021   12:42 PM 11/22/2019    2:55 PM 03/12/2017    1:56 PM  Advanced Directives  Does Patient Have a Medical Advance Directive? No No No No  Would patient like information on creating a medical advance directive?   Yes (ED - Information included in AVS)     Current Medications (verified) Outpatient Encounter Medications as of 03/21/2023  Medication Sig   acetaminophen (TYLENOL) 500 MG tablet Take 500 mg by mouth every 6 (six) hours as needed for moderate pain.   FIBER ADULT GUMMIES PO Take 2 tablets by mouth daily. Metamucil Gummies   GEMTESA 75 MG TABS Take 75 mg by mouth daily.   HYDROcodone-acetaminophen (NORCO) 5-325 MG tablet Take 1 tablet by mouth every 6 (six) hours as needed for moderate pain.   hydroxypropyl methylcellulose / hypromellose (ISOPTO TEARS / GONIOVISC) 2.5 % ophthalmic solution Place 1 drop into both eyes 3 (three) times daily as needed for dry eyes.   ibuprofen (ADVIL) 600 MG tablet TAKE 1 TABLET(600 MG) BY MOUTH EVERY 12 HOURS AS NEEDED FOR CRAMPING   melatonin 5 MG TABS Take 5 mg by mouth at bedtime as needed (sleep).   Multiple Vitamin (MULTIVITAMIN WITH MINERALS) TABS tablet Take 1 tablet by mouth daily.   mupirocin ointment (BACTROBAN) 2 % Apply 1 Application  topically daily as needed (nose).   Phenyleph-Doxylamine-DM-APAP (NYQUIL SEVERE COLD/FLU) 5-6.25-10-325 MG/15ML LIQD Take 15 mLs by mouth every 6 (six) hours as needed (cold symptoms).   simethicone (GAS-X) 80 MG chewable tablet Chew 1 tablet (80 mg total) by mouth every 8 (eight) hours as needed for flatulence.   Chromium Picolinate 1000 MCG TABS Take 1,000 mcg by mouth daily. (Patient not taking: Reported on 08/29/2022)   No facility-administered encounter medications on file as of 03/21/2023.    Allergies (verified) Patient has no known allergies.   History: Past Medical History:  Diagnosis Date   Headache    HOH (hard of hearing)    wears hearing aids   Hx of seasonal allergies    PONV (postoperative nausea and vomiting)    Treacher Collins syndrome    Past Surgical History:  Procedure Laterality Date   BILATERAL FACIAL FAT GRAFTING FOR TREACHER COLLINS SYNDROME     x 2 - 05/2017, 11/2017   EXTERNAL EAR SURGERY     KELOID EXCISION  02/20/2020   TOOTH EXTRACTION N/A 05/12/2022   Procedure: DENTAL RESTORATION/EXTRACTIONS;  Surgeon: Ocie Doyne, DMD;  Location: [facility] OR;  Service: Oral Surgery;  Laterality: N/A;   History reviewed. No pertinent family history. Social History   Socioeconomic History   Marital status: Single    Spouse name: Not on file   Number of children: Not on file  Years of education: Not on file   Highest education level: Not on file  Occupational History   Not on file  Tobacco Use   Smoking status: Never   Smokeless tobacco: Never  Vaping Use   Vaping Use: Never used  Substance and Sexual Activity   Alcohol use: No   Drug use: No   Sexual activity: Not Currently    Birth control/protection: None  Other Topics Concern   Not on file  Social History Narrative   Not on file   Social Determinants of Health   Financial Resource Strain: Low Risk  (03/21/2023)   Overall Financial Resource Strain (CARDIA)    Difficulty of Paying Living Expenses: Not  hard at all  Food Insecurity: No Food Insecurity (03/21/2023)   Hunger Vital Sign    Worried About Running Out of Food in the Last Year: Never true    Ran Out of Food in the Last Year: Never true  Transportation Needs: No Transportation Needs (03/21/2023)   PRAPARE - Administrator, Civil Service (Medical): No    Lack of Transportation (Non-Medical): No  Physical Activity: Inactive (03/21/2023)   Exercise Vital Sign    Days of Exercise per Week: 0 days    Minutes of Exercise per Session: 0 min  Stress: No Stress Concern Present (03/21/2023)   [employer] of Occupational Health - Occupational Stress Questionnaire    Feeling of Stress : Only a little  Social Connections: Moderately Isolated (10/05/2021)   Social Connection and Isolation Panel [NHANES]    Frequency of Communication with Friends and Family: Twice a week    Frequency of Social Gatherings with Friends and Family: Twice a week    Attends Religious Services: 1 to 4 times per year    Active Member of [employer] or Organizations: No    Attends Engineer, structural: Never    Marital Status: Never married    Tobacco Counseling Counseling given: Not Answered   Clinical Intake:  Pre-visit preparation completed: Yes  Pain : No/denies pain     Nutritional Status: BMI of 19-24  Normal Nutritional Risks: None Diabetes: No  How often do you need to have someone help you when you read instructions, pamphlets, or other written materials from your doctor or pharmacy?: 1 - Never  Diabetic? no  Interpreter Needed?: No  Information entered by :: NAllen LPN   Activities of Daily Living    03/21/2023    3:05 PM 05/12/2022    8:37 AM  In your present state of health, do you have any difficulty performing the following activities:  Hearing? 1   Comment has hearing aids   Vision? 0   Difficulty concentrating or making decisions? 0   Walking or climbing stairs? 0   Dressing or bathing? 0   Doing errands,  shopping? 0 0  Preparing Food and eating ? N   Using the Toilet? N   In the past six months, have you accidently leaked urine? N   Do you have problems with loss of bowel control? N   Managing your Medications? N   Managing your Finances? N   Housekeeping or managing your Housekeeping? N     Patient Care Team: Hoy Register, MD as PCP - General (Family Medicine)  Indicate any recent Medical Services you may have received from other than [facility] providers in the past year (date may be approximate).     Assessment:   This is a routine wellness examination for  Caitlin Banks.  Hearing/Vision screen Vision Screening - Comments:: Regular eye exams, [facility]  Dietary issues and exercise activities discussed: Current Exercise Habits: The patient does not participate in regular exercise at present   Goals Addressed             This Visit's Progress    Patient Stated       03/21/2023, drink more water and eat fruits and vegetables       Depression Screen    03/21/2023    3:05 PM 08/29/2022   11:30 AM 03/14/2022    3:42 PM 08/24/2021    4:07 PM 07/21/2021    3:45 PM 11/08/2020    4:18 PM 09/29/2020    3:11 PM  PHQ 2/9 Scores  PHQ - 2 Score 0 1 0 0 PHQ- 9 Score  Fall Risk    03/21/2023    3:04 PM 08/29/2022   11:30 AM 03/14/2022    3:41 PM 08/24/2021    4:02 PM 07/21/2021    3:44 PM  Fall Risk   Falls in the past year? 0 0 0 0 0  Number falls in past yr: 0 0 0 0 0  Injury with Fall? 0 0 0 0 0  Risk for fall due to : Medication side effect No Fall Risks  No Fall Risks No Fall Risks  Follow up Falls prevention discussed;Education provided;Falls evaluation completed        FALL RISK PREVENTION PERTAINING TO THE HOME:  Any stairs in or around the home? Yes  If so, are there any without handrails? No  Home free of loose throw rugs in walkways, pet beds, electrical cords, etc? Yes  Adequate lighting in your home to reduce risk of falls? Yes   ASSISTIVE DEVICES  UTILIZED TO PREVENT FALLS:  Life alert? No  Use of a cane, walker or w/c? No  Grab bars in the bathroom? No  Shower chair or bench in shower? No  Elevated toilet seat or a handicapped toilet? No   TIMED UP AND GO:  Was the test performed? No .       Cognitive Function:        03/21/2023    3:07 PM  6CIT Screen  What Year? 0 points  What month? 0 points  What time? 0 points  Count back from 20 0 points  Months in reverse 0 points  Repeat phrase 2 points  Total Score 2 points    Immunizations Immunization History  Administered Date(s) Administered   Influenza,inj,Quad PF,6+ Mos 01/22/2019, 12/10/2019, 08/29/2022   Moderna Sars-Covid-2 Vaccination 03/11/2020, 04/13/2020   Tdap 03/12/2017    TDAP status: Up to date  Flu Vaccine status: Up to date  Pneumococcal vaccine status: Up to date  Covid-19 vaccine status: Completed vaccines  Qualifies for Shingles Vaccine? No   Zostavax completed  n/a   Shingrix Completed?: n/a  Screening Tests Health Maintenance  Topic Date Due   Medicare Annual Wellness (AWV)  Never done   PAP SMEAR-Modifier  Never done   COVID-19 Vaccine (3 - 2023-24 season) 07/28/2022   INFLUENZA VACCINE  06/28/2023   DTaP/Tdap/Td (2 - Td or Tdap) 03/13/2027   Hepatitis C ScreenAurea Graffng  Completed   HIV Screening  Completed   HPV VACCINES  Aged Out    Health Maintenance  Health Maintenance Due  Topic Date Due   Medicare Annual Wellness (AWV)  Never done   PAP  SMEAR-Modifier  Never done   COVID-19 Vaccine (3 - 2023-24 season) 07/28/2022    Colorectal cancer screening: No longer required.   Mammogram status: No longer required due to age.  Bone Density status: n/a  Lung Cancer Screening: (Low Dose CT Chest recommended if Age 62-80 years, 30 pack-year currently smoking OR have quit w/in 15years.) does not qualify.   Lung Cancer Screening Referral: no  Additional Screening:  Hepatitis C Screening: does qualify; Completed  12/07/2021  Vision Screening: Recommended annual ophthalmology exams for early detection of glaucoma and other disorders of the eye. Is the patient up to date with their annual eye exam?  Yes  Who is the provider or what is the name of the office in which the patient attends annual eye exams? [facility] If pt is not established with a provider, would they like to be referred to a provider to establish care? No .   Dental Screening: Recommended annual dental exams for proper oral hygiene  Community Resource Referral / Chronic Care Management: CRR required this visit?  No   CCM required this visit?  No      Plan:     I have personally reviewed and noted the following in the patient's chart:   Medical and social history Use of alcohol, tobacco or illicit drugs  Current medications and supplements including opioid prescriptions. Patient is currently taking opioid prescriptions. Information provided to patient regarding non-opioid alternatives. Patient advised to discuss non-opioid treatment plan with their provider. Functional ability and status Nutritional status Physical activity Advanced directives List of other physicians Hospitalizations, surgeries, and ER visits in previous 12 months Vitals Screenings to include cognitive, depression, and falls Referrals and appointments  In addition, I have reviewed and discussed with patient certain preventive protocols, quality metrics, and best practice recommendations. A written personalized care plan for preventive services as well as general preventive health recommendations were provided to patient.     Barb Merino, LPN   1/61/0960   Nurse Notes: none  Due to this being a virtual visit, the after visit summary with patients personalized plan was offered to patient via mail or my-chart.  Patient would like to access on my-chart

## 2023-03-29 DIAGNOSIS — Z9621 Cochlear implant status: Secondary | ICD-10-CM | POA: Diagnosis not present

## 2023-03-29 DIAGNOSIS — L910 Hypertrophic scar: Secondary | ICD-10-CM | POA: Diagnosis not present

## 2023-05-11 ENCOUNTER — Encounter (HOSPITAL_COMMUNITY): Payer: Self-pay

## 2023-05-11 ENCOUNTER — Ambulatory Visit (HOSPITAL_COMMUNITY)
Admission: EM | Admit: 2023-05-11 | Discharge: 2023-05-11 | Disposition: A | Discharge status: Home / Self Care | Payer: 59 | Attending: Emergency Medicine | Admitting: Emergency Medicine

## 2023-05-11 DIAGNOSIS — N898 Other specified noninflammatory disorders of vagina: Secondary | ICD-10-CM | POA: Insufficient documentation

## 2023-05-11 DIAGNOSIS — N946 Dysmenorrhea, unspecified: Secondary | ICD-10-CM | POA: Insufficient documentation

## 2023-05-11 DIAGNOSIS — R0989 Other specified symptoms and signs involving the circulatory and respiratory systems: Secondary | ICD-10-CM

## 2023-05-11 DIAGNOSIS — R519 Headache, unspecified: Secondary | ICD-10-CM | POA: Insufficient documentation

## 2023-05-11 LAB — POCT URINE PREGNANCY: Preg Test, Ur: NEGATIVE

## 2023-05-11 MED ORDER — CETIRIZINE HCL 10 MG PO TABS: 10.0000 mg | ORAL_TABLET | Freq: Every day | ORAL | 2 refills | Status: AC

## 2023-05-11 MED ORDER — IBUPROFEN 600 MG PO TABS: 600.0000 mg | ORAL_TABLET | Freq: Four times a day (QID) | ORAL | 0 refills | Status: DC | PRN

## 2023-05-11 NOTE — ED Triage Notes (Signed)
Patient having headaches and brown discharge onset last week. Patient having vaginal itching.

## 2023-05-11 NOTE — Discharge Instructions (Addendum)
I will call you if anything on your swab returns positive. This can take 24 hours to result.  For headache, I recommend to use ibuprofen and/or tylenol.  I have sent the ibuprofen as a prescription. You can get tylenol at the store.  You can also start the once daily zyrtec to help with runny nose and sneezing.   Please return if symptoms persist or worsen.

## 2023-05-11 NOTE — ED Provider Notes (Signed)
[facility]    CSN: [id number] Arrival date & time: 05/11/23  1326     History   Chief Complaint Chief Complaint  Patient presents with   Headache   Vaginal Discharge    HPI Caitlin Banks is a 32 y.o. female.  Here with brown vaginal discharge since end of her menstrual cycle last week. A little itching.  She is not sexually active. Per chart review she has history of recurrent BV and yeast, a lot of the time occurring after her menstrual cycles. No abdominal pain or cramping. Denies NVD or urinary symptoms.  Also reporting headaches intermittently over the week. Not every day. Currently rates 4 or 5 out of 10. Feels frontal. She had "a cold a few days ago". Better but having runny nose and sneezing.  No head injury or trauma. No fever. Denies dizziness, vision changes, neck or back pain. She does have hx of headache and uses ibuprofen/tylenol with relief. She has not taken any medications this time.   Past Medical History:  Diagnosis Date   Headache    HOH (hard of hearing)    wears hearing aids   Hx of seasonal allergies    PONV (postoperative nausea and vomiting)    Treacher Collins syndrome     Patient Active Problem List   Diagnosis Date Noted   Treacher Collins syndrome 03/12/2017    Past Surgical History:  Procedure Laterality Date   BILATERAL FACIAL FAT GRAFTING FOR TREACHER COLLINS SYNDROME     x 2 - 05/2017, 11/2017   EXTERNAL EAR SURGERY     KELOID EXCISION  02/20/2020   TOOTH EXTRACTION N/A 05/12/2022   Procedure: DENTAL RESTORATION/EXTRACTIONS;  Surgeon: Ocie Doyne, DMD;  Location: [facility] OR;  Service: Oral Surgery;  Laterality: N/A;    OB History   No obstetric history on file.      Home Medications    Prior to Admission medications   Medication Sig Start Date End Date Taking? Authorizing Provider  acetaminophen (TYLENOL) 500 MG tablet Take 500 mg by mouth every 6 (six) hours as needed for moderate pain.   Yes [provider]  cetirizine (ZYRTEC ALLERGY) 10 MG tablet Take 1 tablet (10 mg total) by mouth daily. 05/11/23  Yes Kearia Yin, PA-C  FIBER ADULT GUMMIES PO Take 2 tablets by mouth daily. Metamucil Gummies   Yes [provider]  GEMTESA 75 MG TABS Take 75 mg by mouth daily. 02/06/22  Yes [provider]  hydroxypropyl methylcellulose / hypromellose (ISOPTO TEARS / GONIOVISC) 2.5 % ophthalmic solution Place 1 drop into both eyes 3 (three) times daily as needed for dry eyes.   Yes [provider]  melatonin 5 MG TABS Take 5 mg by mouth at bedtime as needed (sleep).   Yes [provider]  Multiple Vitamin (MULTIVITAMIN WITH MINERALS) TABS tablet Take 1 tablet by mouth daily.   Yes [provider]  ibuprofen (ADVIL) 600 MG tablet Take 1 tablet (600 mg total) by mouth every 6 (six) hours as needed. TAKE 1 TABLET(600 MG) BY MOUTH EVERY 12 HOURS AS NEEDED FOR CRAMPING 05/11/23   Hedy Garro, [place]    Family History History reviewed. No pertinent family history.  Social History Social History   Tobacco Use   Smoking status: Never   Smokeless tobacco: Never  Vaping Use   Vaping Use: Never used  Substance Use Topics   Alcohol use: No   Drug use: No     Allergies  Patient has no known allergies.   Review of Systems Review of Systems As per HPI  Physical Exam Triage Vital Signs ED Triage Vitals  Enc Vitals Group     BP 05/11/23 1408 114/73     Pulse Rate 05/11/23 1408 63     Resp 05/11/23 1408 16     Temp 05/11/23 1408 98.4 F (36.9 C)     Temp Source 05/11/23 1408 Oral     SpO2 05/11/23 1408 98 %     Weight --      Height 05/11/23 1408 5\' 7"  (1.702 m)     Head Circumference --      Peak Flow --      Pain Score 05/11/23 1402 6     Pain Loc --      Pain Edu? --      Excl. in GC? --    No data found.  Updated Vital Signs BP 114/73 (BP Location: Left Arm)   Pulse 63   Temp 98.4 F (36.9 C) (Oral)   Resp 16   Ht 5\' 7"  (1.702 m)    LMP 05/03/2023 (Exact Date)   SpO2 98%   BMI 18.79 kg/m    Physical Exam Vitals and nursing note reviewed.  Constitutional:      General: She is not in acute distress.    Appearance: She is not ill-appearing.  HENT:     Head: Atraumatic.     Ears:     Comments: Hearing aid in place over R ear. Left ear with external congenital changes.    Nose: No rhinorrhea.     Mouth/Throat:     Mouth: Mucous membranes are moist.     Pharynx: Oropharynx is clear. No posterior oropharyngeal erythema.  Eyes:     Extraocular Movements: Extraocular movements intact.     Pupils: Pupils are equal, round, and reactive to light.  Cardiovascular:     Rate and Rhythm: Normal rate and regular rhythm.     Heart sounds: Normal heart sounds.  Pulmonary:     Breath sounds: Normal breath sounds.  Abdominal:     General: There is no distension.     Palpations: Abdomen is soft. There is no mass.     Tenderness: There is no abdominal tenderness. There is no guarding.  Musculoskeletal:        General: Normal range of motion.     Cervical back: Normal range of motion. No rigidity.  Skin:    General: Skin is warm and dry.  Neurological:     Mental Status: She is alert and oriented to person, place, and time.     Cranial Nerves: No cranial nerve deficit.     Motor: No weakness.      UC Treatments / Results  Labs (all labs ordered are listed, but only abnormal results are displayed) Labs Reviewed  POCT URINE PREGNANCY  CERVICOVAGINAL ANCILLARY ONLY   EKG  Radiology No results found.  Procedures Procedures (including critical care time)  Medications Ordered in UC Medications - No data to display  Initial Impression / Assessment and Plan / UC Course  I have reviewed the triage vital signs and the nursing notes.  Pertinent labs & imaging results that were available during my care of the patient were reviewed by me and considered in my medical decision making (see chart for  details).  Although patient denies being sexually active I have still checked a UPT given patient has a uterus and possible vaginal bleeding.  Result is negative. Suspect BV or yeast again given her history of this. Cytology swab is pending for BV/yeast only. No STD testing today.  Mild headache, maybe related to recent cold symptoms or allergies. No red flags, stable virals. Will use ibuprofen and/or tylenol. Will start once daily zyrtec for runny nose and sneezing. Advised return if persisting or worsening symptoms, or follow with PCP.  Patient agreeable to plan, no questions at this time   Final Clinical Impressions(s) / UC Diagnoses   Final diagnoses:  Vaginal discharge  Runny nose  Mild headache     Discharge Instructions      I will call you if anything on your swab returns positive. This can take 24 hours to result.  For headache, I recommend to use ibuprofen and/or tylenol.  I have sent the ibuprofen as a prescription. You can get tylenol at the store.  You can also start the once daily zyrtec to help with runny nose and sneezing.   Please return if symptoms persist or worsen.      ED Prescriptions     Medication Sig Dispense Auth. Provider   ibuprofen (ADVIL) 600 MG tablet Take 1 tablet (600 mg total) by mouth every 6 (six) hours as needed. TAKE 1 TABLET(600 MG) BY MOUTH EVERY 12 HOURS AS NEEDED FOR CRAMPING 30 tablet Seona Clemenson, PA-C   cetirizine (ZYRTEC ALLERGY) 10 MG tablet Take 1 tablet (10 mg total) by mouth daily. 30 tablet Merit Maybee, Lurena Joiner, PA-C      PDMP not reviewed this encounter.   Kelcee Bjorn, Lurena Joiner, [place] 05/11/23 1623

## 2023-05-14 LAB — CERVICOVAGINAL ANCILLARY ONLY
Bacterial Vaginitis (gardnerella): NEGATIVE
Candida Glabrata: NEGATIVE
Candida Vaginitis: NEGATIVE
Comment: NEGATIVE
Comment: NEGATIVE
Comment: NEGATIVE

## 2023-08-09 DIAGNOSIS — R350 Frequency of micturition: Secondary | ICD-10-CM | POA: Diagnosis not present

## 2023-08-09 DIAGNOSIS — R3915 Urgency of urination: Secondary | ICD-10-CM | POA: Diagnosis not present

## 2023-08-30 ENCOUNTER — Ambulatory Visit: Payer: 59 | Attending: Family Medicine | Admitting: Family Medicine

## 2023-08-30 ENCOUNTER — Encounter: Payer: Self-pay | Admitting: Family Medicine

## 2023-08-30 ENCOUNTER — Other Ambulatory Visit (HOSPITAL_COMMUNITY)
Admission: RE | Admit: 2023-08-30 | Discharge: 2023-08-30 | Disposition: A | Discharge status: Home / Self Care | Payer: 59 | Source: Ambulatory Visit | Attending: Family Medicine | Admitting: Family Medicine

## 2023-08-30 VITALS — BP 110/65 | HR 72 | Wt 118.8 lb

## 2023-08-30 DIAGNOSIS — Q754 Mandibulofacial dysostosis: Secondary | ICD-10-CM | POA: Diagnosis not present

## 2023-08-30 DIAGNOSIS — Z13228 Encounter for screening for other metabolic disorders: Secondary | ICD-10-CM

## 2023-08-30 DIAGNOSIS — N898 Other specified noninflammatory disorders of vagina: Secondary | ICD-10-CM | POA: Insufficient documentation

## 2023-08-30 DIAGNOSIS — N946 Dysmenorrhea, unspecified: Secondary | ICD-10-CM | POA: Diagnosis not present

## 2023-08-30 MED ORDER — IBUPROFEN 600 MG PO TABS: 600.0000 mg | ORAL_TABLET | Freq: Four times a day (QID) | ORAL | 2 refills | Status: DC | PRN

## 2023-08-30 NOTE — Patient Instructions (Signed)
Dysmenorrhea Dysmenorrhea refers to cramps caused by the muscles of the uterus tightening (contracting) during a menstrual period. Dysmenorrhea may be mild, or it may be severe enough to interfere with everyday activities for a few days each month. Primary dysmenorrhea is menstrual cramps that last a couple of days when a female starts having menstrual periods or soon after. As a female gets older or has a baby, the cramps will usually lessen or disappear. Secondary dysmenorrhea begins later in life and is caused by a disorder in the reproductive system. It lasts longer, and it may cause more pain than primary dysmenorrhea. The pain may start before the period and last a few days after the period. What are the causes? Dysmenorrhea is usually caused by an underlying problem, such as: Endometriosis. The tissue that lines the uterus (endometrium) growing outside of the uterus in other areas of the body. Adenomyosis. Endometrial tissue growing into the muscular walls of the uterus. Pelvic congestive syndrome. Blood vessels in the pelvis that fill with blood just before the menstrual period. Overgrowth of cells (polyps) in the endometrium or the lower part of the uterus (cervix). Uterine prolapse. The uterus dropping down into the vagina due to stretched or weak muscles. Bladder problems, such as infection or inflammation. Intestinal problems, such as a tumor or irritable bowel syndrome. Cancer of the reproductive organs or bladder. Other causes of this condition may result from: A severely tipped uterus. A cervix that is closed or has a small opening. Noncancerous (benign) tumors in the uterus (fibroids). Pelvic inflammatory disease (PID). Pelvic scarring (adhesions) from a previous surgery. An ovarian cyst. An IUD (intrauterine device). What increases the risk? You are more likely to develop this condition if: You are younger than 34 years old. You started puberty early. You have irregular or  heavy bleeding. You have never given birth. You have a family history of dysmenorrhea. You smoke or use nicotine products. You have high body weight or a low body weight. What are the signs or symptoms? Symptoms of this condition include: Cramping, throbbing pain in lower abdomen or lower back, or a feeling of fullness in the lower abdomen. Periods lasting for longer than 7 days. Headaches. Bloating. Fatigue. Nausea or vomiting. Diarrhea or loose stools. Sweating or dizziness. How is this diagnosed? This condition may be diagnosed based on: Your symptoms. Your medical history. A physical exam. Blood tests. A Pap test. This is a test in which cells from the cervix are tested for signs of cancer or infection. A pregnancy test. You may also have other tests, including: Imaging tests, such as: Ultrasound. A procedure to remove and examine a sample of endometrial tissue (dilation and curettage, D&C). A procedure to visually examine the inside of: The uterus (hysteroscopy). The abdomen or pelvis (laparoscopy). The bladder (cystoscopy). X-rays. CT scan. MRI. How is this treated? Treatment depends on the cause of the dysmenorrhea. Treatment may include medicines, such as: Pain medicines. Hormone replacement therapy. Injections of progesterone to stop the menstrual period. Birth control pills that contain the hormone progesterone. An IUD that contains the hormone progesterone. NSAIDs, such as ibuprofen. These may help to stop the production of hormones that cause cramps. Antidepressant medicines. Other treatment may include: Surgery to remove adhesions, endometriosis, ovarian cysts, fibroids, or the entire uterus (hysterectomy). Endometrial ablation. This is a procedure to destroy the endometrium. Presacral neurectomy. This is a procedure to cut the nerves in the bottom of the spine (sacrum) that go to the reproductive organs. Sacral nerve  stimulation. This is a procedure to  apply an electric current to nerves in the sacrum. Exercise and physical therapy. Meditation, yoga, and acupuncture. Work with your health care provider to determine what treatment or combination of treatments is best for you. Follow these instructions at home: Relieving pain and cramping  If directed, apply heat to your lower back or abdomen when you experience pain or cramps. Use the heat source that your health care provider recommends, such as a moist heat pack or a heating pad. Place a towel between your skin and the heat source. Leave the heat on for 20-30 minutes. Remove the heat if your skin turns bright red. This is especially important if you are unable to feel pain, heat, or cold. You may have a greater risk of getting burned. Do not sleep with a heating pad on. Exercise. Activities such as walking, swimming, or biking can help to relieve cramps. Massage your lower back or abdomen to help relieve pain. General instructions Take over-the-counter and prescription medicines only as told by your health care provider. Ask your health care provider if the medicine prescribed to you requires you to avoid driving or using machinery. Avoid alcohol and caffeine during and right before your period. These can make cramps worse. Do not use any products that contain nicotine or tobacco. These products include cigarettes, chewing tobacco, and vaping devices, such as e-cigarettes. If you need help quitting, ask your health care provider. Keep all follow-up visits. This is important. Contact a health care provider if: You have pain that gets worse or does not get better with medicine. You have pain with sex. You develop nausea or vomiting with your period that is not controlled with medicine. Get help right away if: You faint. Summary Dysmenorrhea refers to cramps caused by the muscles of the uterus tightening (contracting) during a menstrual period. Dysmenorrhea may be mild, or it may be  severe enough to interfere with everyday activities for a few days each month. Treatment depends on the cause of the dysmenorrhea. Work with your health care provider to determine what treatment or combination of treatments is best for you. This information is not intended to replace advice given to you by your health care provider. Make sure you discuss any questions you have with your health care provider. Document Revised: 06/29/2020 Document Reviewed: 06/30/2020 Elsevier Patient Education  2024 [employer].

## 2023-08-30 NOTE — Progress Notes (Signed)
Subjective:  Patient ID: Caitlin Banks, female    DOB: [birth year]  Age: 32 y.o. MRN: [id number]  CC: Vaginitis and Medication Refill   HPI Caitlin Banks is a 32 y.o. 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     The patient presents complains of a vaginal infection, characterized by itching.  Has had bacterial vaginosis in the past.  She also requests a refill for ibuprofen, which she takes for menstrual cramps. The patient has not eaten this morning and is due for a blood test to check cholesterol, kidney function, liver function, and A1c levels. She has also not had a flu shot and agrees to receive one today. However, she declines a Pap smear for cervical cancer screening. The patient also mentions not exercising regularly.  She continues to follow-up with ENT and audiology due to her hearing loss.      Past Medical History:  Diagnosis Date   Headache    HOH (hard of hearing)    wears hearing aids   Hx of seasonal allergies    PONV (postoperative nausea and vomiting)    Treacher Collins syndrome     Past Surgical History:  Procedure Laterality Date   BILATERAL FACIAL FAT GRAFTING FOR TREACHER COLLINS SYNDROME     x 2 - 05/2017, 11/2017   EXTERNAL EAR SURGERY     KELOID EXCISION  02/20/2020   TOOTH EXTRACTION N/A 05/12/2022   Procedure: DENTAL RESTORATION/EXTRACTIONS;  Surgeon: Ocie Doyne, DMD;  Location: [facility] OR;  Service: Oral Surgery;  Laterality: N/A;    No family history on file.  Social History   Socioeconomic History   Marital status: Single    Spouse name: Not on file   Number of children: Not on file   Years of education: Not on file   Highest education level: Not on file   Occupational History   Not on file  Tobacco Use   Smoking status: Never   Smokeless tobacco: Never  Vaping Use   Vaping status: Never Used  Substance and Sexual Activity   Alcohol use: No   Drug use: No   Sexual activity: Not Currently    Birth control/protection: None  Other Topics Concern   Not on file  Social History Narrative   Not on file   Social Determinants of Health   Financial Resource Strain: Low Risk  (03/21/2023)   Overall Financial Resource Strain (CARDIA)    Difficulty of Paying Living Expenses: Not hard at all  Food Insecurity: No Food Insecurity (03/21/2023)   Hunger Vital Sign    Worried About Running Out of Food in the Last Year: Never true    Ran Out of Food in the Last Year: Never true  Transportation Needs: No Transportation Needs (03/21/2023)   PRAPARE - Administrator, Civil Service (Medical): No    Lack of Transportation (Non-Medical): No  Physical Activity: Inactive (03/21/2023)   Exercise Vital Sign    Days of Exercise per Week: 0 days    Minutes of Exercise per Session: 0 min  Stress: No Stress Concern Present (03/21/2023)   [employer] of Occupational Health - Occupational Stress Questionnaire    Feeling of Stress : Only a little  Social Connections:  Moderately Isolated (10/05/2021)   Social Connection and Isolation Panel [NHANES]    Frequency of Communication with Friends and Family: Twice a week    Frequency of Social Gatherings with Friends and Family: Twice a week    Attends Religious Services: 1 to 4 times per year    Active Member of [employer] or Organizations: No    Attends Banker Meetings: Never    Marital Status: Never married    No Known Allergies  Outpatient Medications Prior to Visit  Medication Sig Dispense Refill   acetaminophen (TYLENOL) 500 MG tablet Take 500 mg by mouth every 6 (six) hours as needed for moderate pain.     cetirizine (ZYRTEC ALLERGY) 10 MG tablet Take 1 tablet (10 mg total) by  mouth daily. 30 tablet 2   FIBER ADULT GUMMIES PO Take 2 tablets by mouth daily. Metamucil Gummies     GEMTESA 75 MG TABS Take 75 mg by mouth daily.     hydroxypropyl methylcellulose / hypromellose (ISOPTO TEARS / GONIOVISC) 2.5 % ophthalmic solution Place 1 drop into both eyes 3 (three) times daily as needed for dry eyes.     melatonin 5 MG TABS Take 5 mg by mouth at bedtime as needed (sleep).     Multiple Vitamin (MULTIVITAMIN WITH MINERALS) TABS tablet Take 1 tablet by mouth daily.     ibuprofen (ADVIL) 600 MG tablet Take 1 tablet (600 mg total) by mouth every 6 (six) hours as needed. TAKE 1 TABLET(600 MG) BY MOUTH EVERY 12 HOURS AS NEEDED FOR CRAMPING 30 tablet 0   No facility-administered medications prior to visit.     ROS Review of Systems  Constitutional:  Negative for activity change and appetite change.  HENT:  Negative for sinus pressure and sore throat.   Respiratory:  Negative for chest tightness, shortness of breath and wheezing.   Cardiovascular:  Negative for chest pain and palpitations.  Gastrointestinal:  Negative for abdominal distention, abdominal pain and constipation.  Genitourinary:  Positive for vaginal discharge.  Musculoskeletal: Negative.   Psychiatric/Behavioral:  Negative for behavioral problems and dysphoric mood.     Objective:  BP 110/65 (BP Location: Right Arm, Patient Position: Sitting, Cuff Size: Normal)   Pulse 72   Wt 118 lb 12.8 oz (53.9 kg)   SpO2 97%   BMI 18.61 kg/m      08/30/2023   10:49 AM 05/11/2023    2:08 PM 03/21/2023    2:57 PM  BP/Weight  Systolic BP 110 114 --  Diastolic BP 65 73 --  Wt. (Lbs) 118.8  120  BMI 18.61 kg/m2  21.26 kg/m2      Physical Exam Constitutional:      Appearance: She is well-developed.  Cardiovascular:     Rate and Rhythm: Normal rate.     Heart sounds: Normal heart sounds. No murmur heard. Pulmonary:     Effort: Pulmonary effort is normal.     Breath sounds: Normal breath sounds. No wheezing  or rales.  Chest:     Chest wall: No tenderness.  Abdominal:     General: Bowel sounds are normal. There is no distension.     Palpations: Abdomen is soft. There is no mass.     Tenderness: There is no abdominal tenderness.  Musculoskeletal:        General: Normal range of motion.     Right lower leg: No edema.     Left lower leg: No edema.  Neurological:     Mental  Status: She is alert and oriented to person, place, and time.  Psychiatric:        Mood and Affect: Mood normal.        Latest Ref Rng & Units 05/12/2022    8:59 AM 03/14/2022    4:38 PM 07/21/2021    3:59 PM  CMP  Glucose 70 - 99 mg/dL 91  99  99   BUN 6 - 20 mg/dL 13  14  11    Creatinine 0.44 - 1.00 mg/dL 1.61  0.96  0.45   Sodium 135 - 145 mmol/L 141  141  139   Potassium 3.5 - 5.1 mmol/L 3.6  4.2  4.1   Chloride 98 - 111 mmol/L 106  104  104   CO2 20 - 29 mmol/L  21  22   Calcium 8.7 - 10.2 mg/dL  9.5  9.2   Total Protein 6.0 - 8.5 g/dL  7.4  7.2   Total Bilirubin 0.0 - 1.2 mg/dL  <4.0  0.3   Alkaline Phos 44 - 121 IU/L  51  43   AST 0 - 40 IU/L  19  16   ALT 0 - 32 IU/L  18  15     Lipid Panel  No results found for: "CHOL", "TRIG", "HDL", "CHOLHDL", "VLDL", "LDLCALC", "LDLDIRECT"  CBC    Component Value Date/Time   WBC 6.2 03/14/2022 1638   WBC 7.7 11/22/2019 1806   RBC 4.89 03/14/2022 1638   RBC 5.49 (H) 11/22/2019 1806   HGB 14.6 05/12/2022 0859   HGB 12.7 03/14/2022 1638   HCT 43.0 05/12/2022 0859   HCT 39.0 03/14/2022 1638   PLT 244 03/14/2022 1638   MCV 80 03/14/2022 1638   MCH 26.0 (L) 03/14/2022 1638   MCH 25.9 (L) 11/22/2019 1806   MCHC 32.6 03/14/2022 1638   MCHC 32.5 11/22/2019 1806   RDW 13.0 03/14/2022 1638   LYMPHSABS 1.8 03/14/2022 1638   EOSABS 0.1 03/14/2022 1638   BASOSABS 0.0 03/14/2022 1638    Lab Results  Component Value Date   HGBA1C 5.0 11/30/2017    Assessment & Plan:      Vaginal itching Reports of vaginal itching. No mention of discharge or other  symptoms. -Perform cervicovaginal ancillary test for diagnosis. -Will call with results and treatment plan.  Dysmenorrhea Reports of using ibuprofen for period pain. -Refill ibuprofen prescription.  General Health Maintenance -Perform blood tests for cholesterol, kidney function, liver function, and A1c. -Administer influenza vaccine today. -Declined Pap smear for cervical cancer screening.  Follow-up -Results will be communicated via MyChart and phone call. -Next appointment in one year.          Meds ordered this encounter  Medications   ibuprofen (ADVIL) 600 MG tablet    Sig: Take 1 tablet (600 mg total) by mouth every 6 (six) hours as needed. TAKE 1 TABLET(600 MG) BY MOUTH EVERY 12 HOURS AS NEEDED FOR CRAMPING    Dispense:  60 tablet    Refill:  2    Follow-up: Return in about 1 year (around 08/29/2024) for Coordination of care.       Hoy Register, MD, FAAFP. [facility] [place], [place] [phone]   08/30/2023, 11:16 AM

## 2023-08-31 LAB — CBC WITH DIFFERENTIAL/PLATELET
Basophils Absolute: 0 10*3/uL (ref 0.0–0.2)
Basos: 1 %
EOS (ABSOLUTE): 0.1 10*3/uL (ref 0.0–0.4)
Eos: 1 %
Hematocrit: 38.5 % (ref 34.0–46.6)
Hemoglobin: 12.1 g/dL (ref 11.1–15.9)
Immature Grans (Abs): 0 10*3/uL (ref 0.0–0.1)
Immature Granulocytes: 0 %
Lymphocytes Absolute: 1.9 10*3/uL (ref 0.7–3.1)
Lymphs: 35 %
MCH: 25.6 pg — ABNORMAL LOW (ref 26.6–33.0)
MCHC: 31.4 g/dL — ABNORMAL LOW (ref 31.5–35.7)
MCV: 82 fL (ref 79–97)
Monocytes Absolute: 0.3 10*3/uL (ref 0.1–0.9)
Monocytes: 6 %
Neutrophils Absolute: 3.1 10*3/uL (ref 1.4–7.0)
Neutrophils: 57 %
Platelets: 259 10*3/uL (ref 150–450)
RBC: 4.72 x10E6/uL (ref 3.77–5.28)
RDW: 14 % (ref 11.7–15.4)
WBC: 5.5 10*3/uL (ref 3.4–10.8)

## 2023-08-31 LAB — CMP14+EGFR
ALT: 13 [IU]/L (ref 0–32)
AST: 16 [IU]/L (ref 0–40)
Albumin: 4.3 g/dL (ref 3.9–4.9)
Alkaline Phosphatase: 42 [IU]/L — ABNORMAL LOW (ref 44–121)
BUN/Creatinine Ratio: 15 (ref 9–23)
BUN: 12 mg/dL (ref 6–20)
Bilirubin Total: 0.2 mg/dL (ref 0.0–1.2)
CO2: 20 mmol/L (ref 20–29)
Calcium: 9.2 mg/dL (ref 8.7–10.2)
Chloride: 106 mmol/L (ref 96–106)
Creatinine, Ser: 0.79 mg/dL (ref 0.57–1.00)
Globulin, Total: 2.7 g/dL (ref 1.5–4.5)
Glucose: 74 mg/dL (ref 70–99)
Potassium: 4.2 mmol/L (ref 3.5–5.2)
Sodium: 140 mmol/L (ref 134–144)
Total Protein: 7 g/dL (ref 6.0–8.5)
eGFR: 101 mL/min/{1.73_m2} (ref >59)

## 2023-08-31 LAB — HEMOGLOBIN A1C
Est. average glucose Bld gHb Est-mCnc: 100 mg/dL
Hgb A1c MFr Bld: 5.1 % (ref 4.8–5.6)

## 2023-08-31 LAB — LP+NON-HDL CHOLESTEROL
Cholesterol, Total: 207 mg/dL — ABNORMAL HIGH (ref 100–199)
HDL: 76 mg/dL (ref >39)
LDL Chol Calc (NIH): 122 mg/dL — ABNORMAL HIGH (ref 0–99)
Total Non-HDL-Chol (LDL+VLDL): 131 mg/dL — ABNORMAL HIGH (ref 0–129)
Triglycerides: 52 mg/dL (ref 0–149)
VLDL Cholesterol Cal: 9 mg/dL (ref 5–40)

## 2023-09-03 ENCOUNTER — Other Ambulatory Visit: Payer: Self-pay | Admitting: Family Medicine

## 2023-09-03 LAB — CERVICOVAGINAL ANCILLARY ONLY
Bacterial Vaginitis (gardnerella): POSITIVE — AB
Candida Glabrata: NEGATIVE
Candida Vaginitis: POSITIVE — AB
Chlamydia: NEGATIVE
Comment: NEGATIVE
Comment: NEGATIVE
Comment: NEGATIVE
Comment: NEGATIVE
Comment: NEGATIVE
Comment: NORMAL
Neisseria Gonorrhea: NEGATIVE
Trichomonas: NEGATIVE

## 2023-09-03 MED ORDER — FLUCONAZOLE 150 MG PO TABS: 150.0000 mg | ORAL_TABLET | Freq: Once | ORAL | 0 refills | Status: AC

## 2023-09-03 MED ORDER — METRONIDAZOLE 500 MG PO TABS: 500.0000 mg | ORAL_TABLET | Freq: Two times a day (BID) | ORAL | 0 refills | Status: AC

## 2024-03-28 DIAGNOSIS — Z9621 Cochlear implant status: Secondary | ICD-10-CM | POA: Diagnosis not present

## 2024-03-28 DIAGNOSIS — Q172 Microtia: Secondary | ICD-10-CM | POA: Diagnosis not present

## 2024-03-28 DIAGNOSIS — H900 Conductive hearing loss, bilateral: Secondary | ICD-10-CM | POA: Diagnosis not present

## 2024-03-28 DIAGNOSIS — L910 Hypertrophic scar: Secondary | ICD-10-CM | POA: Diagnosis not present

## 2024-03-28 DIAGNOSIS — Q754 Mandibulofacial dysostosis: Secondary | ICD-10-CM | POA: Diagnosis not present

## 2024-04-01 ENCOUNTER — Ambulatory Visit: Payer: 59 | Attending: Family Medicine

## 2024-04-01 VITALS — Ht 65.0 in | Wt 122.0 lb

## 2024-04-01 DIAGNOSIS — Z0000 Encounter for general adult medical examination without abnormal findings: Secondary | ICD-10-CM

## 2024-04-01 NOTE — Patient Instructions (Addendum)
 Caitlin Banks , Thank you for taking time to come for your Medicare Wellness Visit. I appreciate your ongoing commitment to your health goals. Please review the following plan we discussed and let me know if I can assist you in the future.   Referrals/Orders/Follow-Ups/Clinician Recommendations: Keep maintaining your health by keeping your appointments with Dr.  Newlin and any specialists that you may see.  Call us  if you need anything.  Have a great year!!!!  Goals: Increase water intake and increase physical activity.  This is a list of the screening recommended for you and due dates:  Health Maintenance  Topic Date Due   COVID-19 Vaccine (5 - 2024-25 season) 08/03/2023   Pap with HPV screening  08/28/2024*   Flu Shot  06/27/2024   Medicare Annual Wellness Visit  04/01/2025   DTaP/Tdap/Td vaccine (2 - Td or Tdap) 03/13/2027   Hepatitis C Screening  Completed   HIV Screening  Completed   HPV Vaccine  Aged Out   Meningitis B Vaccine  Aged Out  *Topic was postponed. The date shown is not the original due date.    Advanced directives: (Declined) Advance directive discussed with you today. Even though you declined this today, please call our office should you change your mind, and we can give you the proper paperwork for you to fill out.  Next Medicare Annual Wellness Visit scheduled for next year: Yes, 04/07/2025 at 2:10 p.m. via PHONE with Nurse Health Advisor  Have you seen your provider in the last 6 months (3 months if uncontrolled diabetes)? Yes, next appointment with Dr. Newlin is 09/01/2024 at 10:50 a.m. in the office.

## 2024-04-01 NOTE — Progress Notes (Signed)
 Because this visit was a virtual/telehealth visit,  certain criteria was not obtained, such a blood pressure, CBG if applicable, and timed get up and go. Any medications not marked as "taking" were not mentioned during the medication reconciliation part of the visit. Any vitals not documented were not able to be obtained due to this being a telehealth visit or patient was unable to self-report a recent blood pressure reading due to a lack of equipment at home via telehealth. Vitals that have been documented are verbally provided by the patient.   Subjective:   Caitlin Banks is a 33 y.o. who presents for a Medicare Wellness preventive visit.  Visit Complete: Virtual I connected with  Caitlin Banks on 04/01/24 by a audio enabled telemedicine application and verified that I am speaking with the correct person using two identifiers.  Patient Location: Home  Provider Location: Office/Clinic  I discussed the limitations of evaluation and management by telemedicine. The patient expressed understanding and agreed to proceed.  Vital Signs: Because this visit was a virtual/telehealth visit, some criteria may be missing or patient reported. Any vitals not documented were not able to be obtained and vitals that have been documented are patient reported.  VideoDeclined- This patient declined Librarian, academic. Therefore the visit was completed with audio only.  Persons Participating in Visit: Patient.  AWV Questionnaire: No: Patient Medicare AWV questionnaire was not completed prior to this visit.  Cardiac Risk Factors include: sedentary lifestyle     Objective:    Today's Vitals   04/01/24 1452  Weight: 122 lb (55.3 kg)  Height: 5\' 5"  (1.651 m)  PainSc: 0-No pain   Body mass index is 20.3 kg/m.     04/01/2024    2:55 PM 03/21/2023    3:04 PM 09/08/2021   12:42 PM 11/22/2019    2:55 PM 03/12/2017    1:56 PM  Advanced Directives  Does Patient Have a  Medical Advance Directive? No No No No No  Would patient like information on creating a medical advance directive? No - Patient declined   Yes (ED - Information included in AVS)     Current Medications (verified) Outpatient Encounter Medications as of 04/01/2024  Medication Sig   acetaminophen  (TYLENOL ) 500 MG tablet Take 500 mg by mouth every 6 (six) hours as needed for moderate pain.   cetirizine  (ZYRTEC  ALLERGY) 10 MG tablet Take 1 tablet (10 mg total) by mouth daily.   FIBER ADULT GUMMIES PO Take 2 tablets by mouth daily. Metamucil Gummies   GEMTESA 75 MG TABS Take 75 mg by mouth daily.   hydroxypropyl methylcellulose / hypromellose (ISOPTO TEARS / GONIOVISC) 2.5 % ophthalmic solution Place 1 drop into both eyes 3 (three) times daily as needed for dry eyes.   ibuprofen  (ADVIL ) 600 MG tablet Take 1 tablet (600 mg total) by mouth every 6 (six) hours as needed. TAKE 1 TABLET(600 MG) BY MOUTH EVERY 12 HOURS AS NEEDED FOR CRAMPING   melatonin 5 MG TABS Take 5 mg by mouth at bedtime as needed (sleep).   Multiple Vitamin (MULTIVITAMIN WITH MINERALS) TABS tablet Take 1 tablet by mouth daily.   No facility-administered encounter medications on file as of 04/01/2024.    Allergies (verified) Patient has no known allergies.   History: Past Medical History:  Diagnosis Date   Headache    HOH (hard of hearing)    wears hearing aids   Hx of seasonal allergies    PONV (postoperative nausea and  vomiting)    Treacher Collins syndrome    Past Surgical History:  Procedure Laterality Date   BILATERAL FACIAL FAT GRAFTING FOR TREACHER COLLINS SYNDROME     x 2 - 05/2017, 11/2017   EXTERNAL EAR SURGERY     KELOID EXCISION  02/20/2020   TOOTH EXTRACTION N/A 05/12/2022   Procedure: DENTAL RESTORATION/EXTRACTIONS;  Surgeon: Ascencion Lava, DMD;  Location: [facility] OR;  Service: Oral Surgery;  Laterality: N/A;   History reviewed. No pertinent family history. Social History   Socioeconomic History   Marital  status: Single    Spouse name: Not on file   Number of children: Not on file   Years of education: Not on file   Highest education level: Not on file  Occupational History   Not on file  Tobacco Use   Smoking status: Never   Smokeless tobacco: Never  Vaping Use   Vaping status: Never Used  Substance and Sexual Activity   Alcohol use: No   Drug use: No   Sexual activity: Not Currently    Birth control/protection: None  Other Topics Concern   Not on file  Social History Narrative   Not on file   Social Drivers of Health   Financial Resource Strain: Low Risk  (04/01/2024)   Overall Financial Resource Strain (CARDIA)    Difficulty of Paying Living Expenses: Not hard at all  Food Insecurity: No Food Insecurity (04/01/2024)   Hunger Vital Sign    Worried About Running Out of Food in the Last Year: Never true    Ran Out of Food in the Last Year: Never true  Transportation Needs: No Transportation Needs (04/01/2024)   PRAPARE - Administrator, Civil Service (Medical): No    Lack of Transportation (Non-Medical): No  Physical Activity: Insufficiently Active (04/01/2024)   Exercise Vital Sign    Days of Exercise per Week: 2 days    Minutes of Exercise per Session: 30 min  Stress: No Stress Concern Present (04/01/2024)   [employer] of Occupational Health - Occupational Stress Questionnaire    Feeling of Stress : Not at all  Social Connections: Moderately Isolated (04/01/2024)   Social Connection and Isolation Panel [NHANES]    Frequency of Communication with Friends and Family: Twice a week    Frequency of Social Gatherings with Friends and Family: Twice a week    Attends Religious Services: 1 to 4 times per year    Active Member of [employer] or Organizations: No    Attends Engineer, structural: Never    Marital Status: Never married    Tobacco Counseling Counseling given: Not Answered    Clinical Intake:  Pre-visit preparation completed: Yes  Pain :  No/denies pain Pain Score: 0-No pain     BMI - recorded: 20.3 Nutritional Risks: None Diabetes: No  Lab Results  Component Value Date   HGBA1C 5.1 08/30/2023   HGBA1C 5.0 11/30/2017     How often do you need to have someone help you when you read instructions, pamphlets, or other written materials from your doctor or pharmacy?: 1 - Never  Interpreter Needed?: No  Information entered by :: Wayne Wicklund N. Tinea Nobile, LPN.   Activities of Daily Living     04/01/2024    2:55 PM  In your present state of health, do you have any difficulty performing the following activities:  Hearing? 1  Comment hearing aids  Vision? 0  Difficulty concentrating or making decisions? 0  Walking or climbing stairs?  0  Dressing or bathing? 0  Doing errands, shopping? 0  Preparing Food and eating ? N  Using the Toilet? N  In the past six months, have you accidently leaked urine? N  Do you have problems with loss of bowel control? N  Managing your Medications? N  Managing your Finances? N  Housekeeping or managing your Housekeeping? N    Patient Care Team: Joaquin Mulberry, MD as PCP - General (Family Medicine) Gregoria Leas, OD as Consulting Physician (Optometry)  Indicate any recent Medical Services you may have received from other than [facility] providers in the past year (date may be approximate).     Assessment:   This is a routine wellness examination for [place].  Hearing/Vision screen Hearing Screening - Comments:: Patient wears hearing aids. Vision Screening - Comments:: Wears rx glasses - up to date with routine eye exams with [facility]    Goals Addressed             This Visit's Progress    Patient Stated       04/01/2024: To drink more water and exercise more.       Depression Screen     04/01/2024    2:56 PM 08/30/2023   10:50 AM 03/21/2023    3:05 PM 08/29/2022   11:30 AM 03/14/2022    3:42 PM 08/24/2021    4:07 PM 07/21/2021    3:45 PM  PHQ 2/9 Scores  PHQ - 2 Score  1 0 0 1 0 0 1  PHQ- 9 Score 1   3 3 4 4     Fall Risk     04/01/2024    2:55 PM 08/30/2023   10:50 AM 03/21/2023    3:04 PM 08/29/2022   11:30 AM 03/14/2022    3:41 PM  Fall Risk   Falls in the past year? 0 0 0 0 0  Number falls in past yr: 0 0 0 0 0  Injury with Fall? 0 0 0 0 0  Risk for fall due to : No Fall Risks No Fall Risks Medication side effect No Fall Risks   Follow up Falls prevention discussed;Falls evaluation completed  Falls prevention discussed;Education provided;Falls evaluation completed      MEDICARE RISK AT HOME:  Medicare Risk at Home Any stairs in or around the home?: Yes If so, are there any without handrails?: No Home free of loose throw rugs in walkways, pet beds, electrical cords, etc?: Yes Adequate lighting in your home to reduce risk of falls?: Yes Life alert?: No Use of a cane, walker or w/c?: No Grab bars in the bathroom?: Yes Shower chair or bench in shower?: No Elevated toilet seat or a handicapped toilet?: No  TIMED UP AND GO:  Was the test performed?  No  Cognitive Function: 6CIT completed    04/01/2024    2:56 PM  MMSE - Mini Mental State Exam  Not completed: Unable to complete        04/01/2024    3:08 PM 03/21/2023    3:07 PM  6CIT Screen  What Year? 0 points 0 points  What month? 0 points 0 points  What time? 0 points 0 points  Count back from 20 0 points 0 points  Months in reverse 0 points 0 points  Repeat phrase 0 points 2 points  Total Score 0 points 2 points    Immunizations Immunization History  Administered Date(s) Administered   Influenza,inj,Quad PF,6+ Mos 01/22/2019, 12/10/2019, 08/29/2022   Influenza-Unspecified 02/22/2023  Moderna Sars-Covid-2 Vaccination 03/11/2020, 04/13/2020   PFIZER(Purple Top)SARS-COV-2 Vaccination 12/29/2020   Pfizer(Comirnaty)Fall Seasonal Vaccine 12 years and older 06/08/2023   Tdap 03/12/2017    Screening Tests Health Maintenance  Topic Date Due   COVID-19 Vaccine (5 - 2024-25 season)  08/03/2023   Cervical Cancer Screening (HPV/Pap Cotest)  08/28/2024 (Originally 02/12/2019)   INFLUENZA VACCINE  06/27/2024   Medicare Annual Wellness (AWV)  04/01/2025   DTaP/Tdap/Td (2 - Td or Tdap) 03/13/2027   Hepatitis C Screening  Completed   HIV Screening  Completed   HPV VACCINES  Aged Out   Meningococcal B Vaccine  Aged Out    Health Maintenance  Health Maintenance Due  Topic Date Due   COVID-19 Vaccine (5 - 2024-25 season) 08/03/2023   Health Maintenance Items Addressed: Yes    Additional Screening:  Vision Screening: Recommended annual ophthalmology exams for early detection of glaucoma and other disorders of the eye.  Dental Screening: Recommended annual dental exams for proper oral hygiene  Community Resource Referral / Chronic Care Management: CRR required this visit?  No   CCM required this visit?  No     Plan:     I have personally reviewed and noted the following in the patient's chart:   Medical and social history Use of alcohol, tobacco or illicit drugs  Current medications and supplements including opioid prescriptions. Patient is not currently taking opioid prescriptions. Functional ability and status Nutritional status Physical activity Advanced directives List of other physicians Hospitalizations, surgeries, and ER visits in previous 12 months Vitals Screenings to include cognitive, depression, and falls Referrals and appointments  In addition, I have reviewed and discussed with patient certain preventive protocols, quality metrics, and best practice recommendations. A written personalized care plan for preventive services as well as general preventive health recommendations were provided to patient.     Margette Sheldon, LPN   12/02/1094   After Visit Summary: (MyChart) Due to this being a telephonic visit, the after visit summary with patients personalized plan was offered to patient via MyChart   Notes: Nothing significant to report  at this time.

## 2024-07-19 ENCOUNTER — Other Ambulatory Visit: Payer: Self-pay

## 2024-07-19 ENCOUNTER — Ambulatory Visit (HOSPITAL_COMMUNITY)
Admission: EM | Admit: 2024-07-19 | Discharge: 2024-07-19 | Disposition: A | Discharge status: Home / Self Care | Attending: Family Medicine | Admitting: Family Medicine

## 2024-07-19 ENCOUNTER — Encounter (HOSPITAL_COMMUNITY): Payer: Self-pay | Admitting: Emergency Medicine

## 2024-07-19 ENCOUNTER — Ambulatory Visit (INDEPENDENT_AMBULATORY_CARE_PROVIDER_SITE_OTHER)

## 2024-07-19 DIAGNOSIS — K5900 Constipation, unspecified: Secondary | ICD-10-CM | POA: Diagnosis not present

## 2024-07-19 DIAGNOSIS — R1030 Lower abdominal pain, unspecified: Secondary | ICD-10-CM

## 2024-07-19 DIAGNOSIS — Z3202 Encounter for pregnancy test, result negative: Secondary | ICD-10-CM

## 2024-07-19 LAB — POCT URINALYSIS DIP (MANUAL ENTRY)
Bilirubin, UA: NEGATIVE
Glucose, UA: NEGATIVE mg/dL
Ketones, POC UA: NEGATIVE mg/dL
Leukocytes, UA: NEGATIVE
Nitrite, UA: NEGATIVE
Spec Grav, UA: 1.015 (ref 1.010–1.025)
Urobilinogen, UA: 0.2 U/dL
pH, UA: 8.5 — AB (ref 5.0–8.0)

## 2024-07-19 LAB — POCT URINE PREGNANCY: Preg Test, Ur: NEGATIVE

## 2024-07-19 MED ORDER — LACTULOSE 20 GM/30ML PO SOLN: 30.0000 mL | Freq: Two times a day (BID) | ORAL | 0 refills | Status: DC | PRN

## 2024-07-19 NOTE — ED Notes (Signed)
 Reviewed work note.  States understanding

## 2024-07-19 NOTE — ED Triage Notes (Signed)
 Lower abdominal pain started yesterday and also complains of thigh pain.  Denies burning with urination or vaginal discharge.  No vomiting or diarrhea

## 2024-07-19 NOTE — Discharge Instructions (Signed)

## 2024-07-19 NOTE — ED Notes (Signed)
 Reportedly staff unable to stick patient for blood work.  Harlene Sink, PA notified of this.  No further orders at this time

## 2024-07-23 NOTE — ED Provider Notes (Signed)
 [facility]   [id number] 07/19/24 Arrival Time: 1418  ASSESSMENT & PLAN:  1. Lower abdominal pain   2. Constipation, unspecified constipation type    I have personally viewed and independently interpreted the imaging studies ordered this visit. ABD 2 v: no evidence of SOB; stool burden  Results for orders placed or performed during the hospital encounter of 07/19/24  POCT urine pregnancy   Collection Time: 07/19/24  5:03 PM  Result Value Ref Range   Preg Test, Ur Negative Negative  POC urinalysis dipstick   Collection Time: 07/19/24  5:04 PM  Result Value Ref Range   Color, UA yellow yellow   Clarity, UA clear clear   Glucose, UA negative negative mg/dL   Bilirubin, UA negative negative   Ketones, POC UA negative negative mg/dL   Spec Grav, UA 8.984 8.989 - 1.025   Blood, UA small (A) negative   pH, UA 8.5 (A) 5.0 - 8.0   Protein Ur, POC trace (A) negative mg/dL   Urobilinogen, UA 0.2 0.2 or 1.0 E.U./dL   Nitrite, UA Negative Negative   Leukocytes, UA Negative Negative    Trial of: Meds ordered this encounter  Medications   Lactulose  20 GM/30ML SOLN    Sig: Take 30 mLs (20 g total) by mouth 2 (two) times daily as needed (for constipation).    Dispense:  450 mL    Refill:  0     Follow-up Information     Schedule an appointment as soon as possible for a visit  with Newlin, Enobong, MD.   Specialty: Family Medicine Why: For follow up. Contact information: [address] [place] [address] [place] [address] [phone]         [facility] at [facility].   Specialty: Emergency Medicine Why: If symptoms worsen in any way. Contact information: [address] [place]  [phone]                Reviewed expectations re: course of current medical issues. Questions answered. Outlined signs and symptoms indicating need for more acute intervention. Patient verbalized  understanding. After Visit Summary given.   SUBJECTIVE: History from: patient.  Caitlin Banks is a 33 y.o. female who presents with complaint of non-radiating lower abdominal discomfort; feeling bloated and uncomfortable. Reports no BM x 1 week. Denies n/v. Appetite remains normal. No tx PTA. Denies urinary symptoms. Patient's last menstrual period was 07/04/2024.  Past Surgical History:  Procedure Laterality Date   BILATERAL FACIAL FAT GRAFTING FOR TREACHER COLLINS SYNDROME     x 2 - 05/2017, 11/2017   EXTERNAL EAR SURGERY     KELOID EXCISION  02/20/2020   TOOTH EXTRACTION N/A 05/12/2022   Procedure: DENTAL RESTORATION/EXTRACTIONS;  Surgeon: Sheryle Hamilton, DMD;  Location: [facility] OR;  Service: Oral Surgery;  Laterality: N/A;    OBJECTIVE:  Vitals:   07/19/24 1636  BP: 130/70  Pulse: 71  Resp: 18  Temp: 97.9 F (36.6 C)  TempSrc: Oral  SpO2: 98%    General appearance: alert; no distress Oropharynx: moist Lungs: clear to auscultation bilaterally; unlabored Heart: regular rate and rhythm Abdomen: soft; non-distended; no significant abdominal tenderness; bowel sounds present; no masses or organomegaly; no guarding or rebound tenderness Back: no CVA tenderness Extremities: no edema; symmetrical with no gross deformities Skin: warm; dry Neurologic: normal gait Psychological: alert and cooperative; normal mood and affect  Labs: Results for orders placed or performed during the hospital encounter of 07/19/24  POCT urine pregnancy   Collection Time: 07/19/24  5:03 PM  Result Value Ref Range   Preg Test, Ur Negative Negative  POC urinalysis dipstick   Collection Time: 07/19/24  5:04 PM  Result Value Ref Range   Color, UA yellow yellow   Clarity, UA clear clear   Glucose, UA negative negative mg/dL   Bilirubin, UA negative negative   Ketones, POC UA negative negative mg/dL   Spec Grav, UA 8.984 8.989 - 1.025   Blood, UA small (A) negative   pH, UA 8.5 (A) 5.0 - 8.0    Protein Ur, POC trace (A) negative mg/dL   Urobilinogen, UA 0.2 0.2 or 1.0 E.U./dL   Nitrite, UA Negative Negative   Leukocytes, UA Negative Negative   Labs Reviewed  POCT URINALYSIS DIP (MANUAL ENTRY) - Abnormal; Notable for the following components:      Result Value   Blood, UA small (*)    pH, UA 8.5 (*)    Protein Ur, POC trace (*)    All other components within normal limits  CBC WITH DIFFERENTIAL/PLATELET  POCT URINE PREGNANCY    Imaging: DG Abd 2 Views Result Date: 07/19/2024 CLINICAL DATA:  Lower abdominal pain and thigh pain. EXAM: ABDOMEN - 2 VIEW COMPARISON:  None Available. FINDINGS: The bowel gas pattern is normal. A moderate to large stool burden is noted. There is no evidence of free air. No radio-opaque calculi or other significant radiographic abnormality is seen. Subcentimeter phleboliths are suspected within the lower pelvis. IMPRESSION: Moderate to large stool burden without evidence of bowel obstruction. Electronically Signed   By: Suzen Dials M.D.   On: 07/19/2024 18:14    No Known Allergies                                             Past Medical History:  Diagnosis Date   Headache    HOH (hard of hearing)    wears hearing aids   Hx of seasonal allergies    PONV (postoperative nausea and vomiting)    Treacher Collins syndrome    Social History   Socioeconomic History   Marital status: Single    Spouse name: Not on file   Number of children: Not on file   Years of education: Not on file   Highest education level: Not on file  Occupational History   Not on file  Tobacco Use   Smoking status: Never   Smokeless tobacco: Never  Vaping Use   Vaping status: Never Used  Substance and Sexual Activity   Alcohol use: No   Drug use: No   Sexual activity: Not Currently    Birth control/protection: None  Other Topics Concern   Not on file  Social History Narrative   Not on file   Social Drivers of Health   Financial Resource Strain: Low Risk   (04/01/2024)   Overall Financial Resource Strain (CARDIA)    Difficulty of Paying Living Expenses: Not hard at all  Food Insecurity: No Food Insecurity (04/01/2024)   Hunger Vital Sign    Worried About Running Out of Food in the Last Year: Never true    Ran Out of Food in the Last Year: Never true  Transportation Needs: No Transportation Needs (04/01/2024)   PRAPARE - Administrator, Civil Service (Medical): No    Lack of Transportation (Non-Medical):  No  Physical Activity: Insufficiently Active (04/01/2024)   Exercise Vital Sign    Days of Exercise per Week: 2 days    Minutes of Exercise per Session: 30 min  Stress: No Stress Concern Present (04/01/2024)   [employer] of Occupational Health - Occupational Stress Questionnaire    Feeling of Stress : Not at all  Social Connections: Moderately Isolated (04/01/2024)   Social Connection and Isolation Panel    Frequency of Communication with Friends and Family: Twice a week    Frequency of Social Gatherings with Friends and Family: Twice a week    Attends Religious Services: 1 to 4 times per year    Active Member of [employer] or Organizations: No    Attends Banker Meetings: Never    Marital Status: Never married  Intimate Partner Violence: Not At Risk (04/01/2024)   Humiliation, Afraid, Rape, and Kick questionnaire    Fear of Current or Ex-Partner: No    Emotionally Abused: No    Physically Abused: No    Sexually Abused: No   History reviewed. No pertinent family history.    Rolinda Rogue, MD 07/23/24 [phone]

## 2024-08-29 ENCOUNTER — Telehealth: Payer: Self-pay | Admitting: Family Medicine

## 2024-08-29 NOTE — Telephone Encounter (Signed)
 1st attempt contacted pt lvm to resch appt pcp not in the office

## 2024-09-01 ENCOUNTER — Ambulatory Visit: Admitting: Family Medicine

## 2024-10-14 ENCOUNTER — Other Ambulatory Visit (HOSPITAL_COMMUNITY)
Admission: RE | Admit: 2024-10-14 | Discharge: 2024-10-14 | Disposition: A | Discharge status: Home / Self Care | Source: Ambulatory Visit | Attending: Family Medicine | Admitting: Family Medicine

## 2024-10-14 ENCOUNTER — Ambulatory Visit: Attending: Family Medicine | Admitting: Family Medicine

## 2024-10-14 ENCOUNTER — Encounter: Payer: Self-pay | Admitting: Family Medicine

## 2024-10-14 VITALS — BP 131/75 | HR 74 | Temp 98.5°F | Ht 65.0 in | Wt 124.8 lb

## 2024-10-14 DIAGNOSIS — Z131 Encounter for screening for diabetes mellitus: Secondary | ICD-10-CM

## 2024-10-14 DIAGNOSIS — Z136 Encounter for screening for cardiovascular disorders: Secondary | ICD-10-CM

## 2024-10-14 DIAGNOSIS — N898 Other specified noninflammatory disorders of vagina: Secondary | ICD-10-CM | POA: Diagnosis present

## 2024-10-14 DIAGNOSIS — N946 Dysmenorrhea, unspecified: Secondary | ICD-10-CM | POA: Diagnosis not present

## 2024-10-14 DIAGNOSIS — Z23 Encounter for immunization: Secondary | ICD-10-CM | POA: Diagnosis not present

## 2024-10-14 DIAGNOSIS — Z13228 Encounter for screening for other metabolic disorders: Secondary | ICD-10-CM

## 2024-10-14 DIAGNOSIS — Z13220 Encounter for screening for lipoid disorders: Secondary | ICD-10-CM | POA: Diagnosis not present

## 2024-10-14 DIAGNOSIS — K5909 Other constipation: Secondary | ICD-10-CM

## 2024-10-14 DIAGNOSIS — Z130 Encounter for screening for diseases of the blood and blood-forming organs and certain disorders involving the immune mechanism: Secondary | ICD-10-CM

## 2024-10-14 MED ORDER — LACTULOSE 20 GM/30ML PO SOLN: 30.0000 mL | Freq: Every day | ORAL | 3 refills | Status: AC | PRN

## 2024-10-14 NOTE — Patient Instructions (Signed)
 VISIT SUMMARY:  Today, you were seen for concerns about a yeast infection and constipation. You also received a flu shot and discussed general health maintenance.  YOUR PLAN:  -VAGINAL DISCHARGE, POSSIBLE VAGINITIS: You have been experiencing a brown discharge without itching, which could be due to a yeast infection or vaginitis. A vaginal swab was performed to determine the cause, and the results will be communicated to you via MyChart.  -CONSTIPATION: You have been having bowel movements every other day, and previous treatment with lactulose  was not effective. You have been prescribed Miralax  to help with this issue, and you are advised to increase your intake of fruits and vegetables.  -GENERAL HEALTH MAINTENANCE: You received a flu shot today. You declined a Pap smear. Your next visit is scheduled in one year for routine blood tests.  INSTRUCTIONS:  Please check MyChart for the results of your vaginal swab. Follow the prescribed treatment for constipation and increase your intake of fruits and vegetables. Your next visit is scheduled in one year for routine blood tests.

## 2024-10-14 NOTE — Addendum Note (Signed)
 Addended by: LONITA FLORETTE GAILS on: 10/14/2024 02:02 PM   Modules accepted: Orders

## 2024-10-14 NOTE — Progress Notes (Signed)
 Subjective:  Patient ID: Caitlin Banks, female    DOB: 10-May-1991  Age: 33 y.o. MRN: [id number]  CC: Medical Management of Chronic Issues (Yeast infection/Constipation)     Discussed the use of AI scribe software for clinical note transcription with the patient, who gave verbal consent to proceed.  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  She experiences a brown discharge without itching, which she associates with a yeast infection. A swab test has been performed previously.  She has constipation with bowel movements every other day and has not used medication recently. Lactulose  was previously prescribed at urgent care without relief.  Due for a PAP smear which she declines.    Past Medical History:  Diagnosis Date   Headache    HOH (hard of hearing)    wears hearing aids   Hx of seasonal allergies    PONV (postoperative nausea and vomiting)    Treacher Collins syndrome     Past Surgical History:  Procedure Laterality Date   BILATERAL FACIAL FAT GRAFTING FOR TREACHER COLLINS SYNDROME     x 2 - 05/2017, 11/2017   EXTERNAL EAR SURGERY     KELOID EXCISION  02/20/2020   TOOTH EXTRACTION N/A 05/12/2022   Procedure: DENTAL RESTORATION/EXTRACTIONS;  Surgeon: Sheryle Hamilton, DMD;  Location: [facility] OR;  Service: Oral Surgery;  Laterality: N/A;    No family history on file.  Social History   Socioeconomic History   Marital status: Single    Spouse name: Not on file   Number of children: Not on file   Years of education: Not on file   Highest education level: Not on file  Occupational History   Not on file  Tobacco Use   Smoking status: Never   Smokeless tobacco: Never  Vaping Use   Vaping status: Never Used  Substance and Sexual  Activity   Alcohol use: No   Drug use: No   Sexual activity: Not Currently    Birth control/protection: None  Other Topics Concern   Not on file  Social History Narrative   Not on file   Social Drivers of Health   Financial Resource Strain: Low Risk  (04/01/2024)   Overall Financial Resource Strain (CARDIA)    Difficulty of Paying Living Expenses: Not hard at all  Food Insecurity: No Food Insecurity (04/01/2024)   Hunger Vital Sign    Worried About Running Out of Food in the Last Year: Never true    Ran Out of Food in the Last Year: Never true  Transportation Needs: No Transportation Needs (04/01/2024)   PRAPARE - Administrator, Civil Service (Medical): No    Lack of Transportation (Non-Medical): No  Physical Activity: Insufficiently Active (04/01/2024)   Exercise Vital Sign    Days of Exercise per Week: 2 days    Minutes of Exercise per Session: 30 min  Stress: No Stress Concern Present (04/01/2024)   [employer] of Occupational Health - Occupational Stress Questionnaire    Feeling of Stress : Not at all  Social Connections: Moderately Isolated (04/01/2024)   Social Connection and Isolation Panel    Frequency of Communication with Friends and Family: Twice a week    Frequency of Social Gatherings with Friends and Family: Twice a week  Attends Religious Services: 1 to 4 times per year    Active Member of Clubs or Organizations: No    Attends Banker Meetings: Never    Marital Status: Never married    No Known Allergies  Outpatient Medications Prior to Visit  Medication Sig Dispense Refill   acetaminophen  (TYLENOL ) 500 MG tablet Take 500 mg by mouth every 6 (six) hours as needed for moderate pain. (Patient not taking: Reported on 10/14/2024)     cetirizine  (ZYRTEC  ALLERGY) 10 MG tablet Take 1 tablet (10 mg total) by mouth daily. 30 tablet 2   FIBER ADULT GUMMIES PO Take 2 tablets by mouth daily. Metamucil Gummies     GEMTESA 75 MG TABS Take 75 mg by  mouth daily.     hydroxypropyl methylcellulose / hypromellose (ISOPTO TEARS / GONIOVISC) 2.5 % ophthalmic solution Place 1 drop into both eyes 3 (three) times daily as needed for dry eyes.     ibuprofen  (ADVIL ) 600 MG tablet Take 1 tablet (600 mg total) by mouth every 6 (six) hours as needed. TAKE 1 TABLET(600 MG) BY MOUTH EVERY 12 HOURS AS NEEDED FOR CRAMPING 60 tablet 2   melatonin 5 MG TABS Take 5 mg by mouth at bedtime as needed (sleep).     Multiple Vitamin (MULTIVITAMIN WITH MINERALS) TABS tablet Take 1 tablet by mouth daily.     Lactulose  20 GM/30ML SOLN Take 30 mLs (20 g total) by mouth 2 (two) times daily as needed (for constipation). 450 mL 0   No facility-administered medications prior to visit.     ROS Review of Systems  Constitutional:  Negative for activity change and appetite change.  HENT:  Negative for sinus pressure and sore throat.   Respiratory:  Negative for chest tightness, shortness of breath and wheezing.   Cardiovascular:  Negative for chest pain and palpitations.  Gastrointestinal:  Negative for abdominal distention, abdominal pain and constipation.  Genitourinary:  Positive for vaginal discharge.  Musculoskeletal: Negative.   Psychiatric/Behavioral:  Negative for behavioral problems and dysphoric mood.     Objective:  BP 131/75   Pulse 74   Temp 98.5 F (36.9 C) (Oral)   Ht 5' 5 (1.651 m)   Wt 124 lb 12.8 oz (56.6 kg)   SpO2 100%   BMI 20.77 kg/m      10/14/2024    1:38 PM 07/19/2024    4:36 PM 04/01/2024    2:52 PM  BP/Weight  Systolic BP 131 130   Diastolic BP 75 70   Wt. (Lbs) 124.8  122  BMI 20.77 kg/m2  20.3 kg/m2      Physical Exam Constitutional:      Appearance: She is well-developed.  Cardiovascular:     Rate and Rhythm: Normal rate.     Heart sounds: Normal heart sounds. No murmur heard. Pulmonary:     Effort: Pulmonary effort is normal.     Breath sounds: Normal breath sounds. No wheezing or rales.  Chest:     Chest wall:  No tenderness.  Abdominal:     General: Bowel sounds are normal. There is no distension.     Palpations: Abdomen is soft. There is no mass.     Tenderness: There is no abdominal tenderness.  Musculoskeletal:        General: Normal range of motion.     Right lower leg: No edema.     Left lower leg: No edema.  Neurological:     Mental Status: She is alert  and oriented to person, place, and time.  Psychiatric:        Mood and Affect: Mood normal.        Latest Ref Rng & Units 08/30/2023   11:51 AM 05/12/2022    8:59 AM 03/14/2022    4:38 PM  CMP  Glucose 70 - 99 mg/dL 74  91  99   BUN 6 - 20 mg/dL 12  13  14    Creatinine 0.57 - 1.00 mg/dL 9.20  9.39  9.17   Sodium 134 - 144 mmol/L 140  141  141   Potassium 3.5 - 5.2 mmol/L 4.2  3.6  4.2   Chloride 96 - 106 mmol/L 106  106  104   CO2 20 - 29 mmol/L 20   21   Calcium  8.7 - 10.2 mg/dL 9.2   9.5   Total Protein 6.0 - 8.5 g/dL 7.0   7.4   Total Bilirubin 0.0 - 1.2 mg/dL 0.2   <9.7   Alkaline Phos 44 - 121 IU/L 42   51   AST 0 - 40 IU/L 16   19   ALT 0 - 32 IU/L 13   18     Lipid Panel     Component Value Date/Time   CHOL 207 (H) 08/30/2023 1151   TRIG 52 08/30/2023 1151   HDL 76 08/30/2023 1151   LDLCALC 122 (H) 08/30/2023 1151    CBC    Component Value Date/Time   WBC 5.5 08/30/2023 1151   WBC 7.7 11/22/2019 1806   RBC 4.72 08/30/2023 1151   RBC 5.49 (H) 11/22/2019 1806   HGB 12.1 08/30/2023 1151   HCT 38.5 08/30/2023 1151   PLT 259 08/30/2023 1151   MCV 82 08/30/2023 1151   MCH 25.6 (L) 08/30/2023 1151   MCH 25.9 (L) 11/22/2019 1806   MCHC 31.4 (L) 08/30/2023 1151   MCHC 32.5 11/22/2019 1806   RDW 14.0 08/30/2023 1151   LYMPHSABS 1.9 08/30/2023 1151   EOSABS 0.1 08/30/2023 1151   BASOSABS 0.0 08/30/2023 1151    Lab Results  Component Value Date   HGBA1C 5.1 08/30/2023       Assessment & Plan Vaginal discharge, possible vaginitis Brown discharge without itching. Differential includes yeast infection  or vaginitis. - Performed vaginal swab to determine cause. - Will communicate results via MyChart.  Constipation Bowel movements every other day. Previous lactulose  ineffective. - Prescribed Miralax . - Advised increased intake of fruits and vegetables. -Counseled on increasing fiber intake, fruits and vegetable, limit intake of foods like cheese, white bread, white rice   General Health Maintenance Agreed to flu shot. Declined Pap smear. Encounter for immunization against influenza - Administered flu shot. - Scheduled next visit in one year for routine blood tests.     Healthcare maintenance Encounter for lipid screening for Cardiovascular Disease - lipid panel ordered Screening for iron deficiency anemia - cbc ordered Due for PAP smear but declines  Meds ordered this encounter  Medications   Lactulose  20 GM/30ML SOLN    Sig: Take 30 mLs (20 g total) by mouth daily as needed (for constipation).    Dispense:  450 mL    Refill:  3    Follow-up: Return in about 1 year (around 10/14/2025) for Annual exam.       Corrina Sabin, MD, FAAFP. [facility] [place], [place] [phone]   10/14/2024, 1:57 PM

## 2024-10-15 ENCOUNTER — Ambulatory Visit: Payer: Self-pay | Admitting: Family Medicine

## 2024-10-15 LAB — CMP14+EGFR
ALT: 17 IU/L (ref 0–32)
AST: 18 IU/L (ref 0–40)
Albumin: 4.3 g/dL (ref 3.9–4.9)
Alkaline Phosphatase: 43 IU/L (ref 41–116)
BUN/Creatinine Ratio: 16 (ref 9–23)
BUN: 13 mg/dL (ref 6–20)
Bilirubin Total: 0.4 mg/dL (ref 0.0–1.2)
CO2: 21 mmol/L (ref 20–29)
Calcium: 9.4 mg/dL (ref 8.7–10.2)
Chloride: 105 mmol/L (ref 96–106)
Creatinine, Ser: 0.81 mg/dL (ref 0.57–1.00)
Globulin, Total: 3.3 g/dL (ref 1.5–4.5)
Glucose: 91 mg/dL (ref 70–99)
Potassium: 4.1 mmol/L (ref 3.5–5.2)
Sodium: 140 mmol/L (ref 134–144)
Total Protein: 7.6 g/dL (ref 6.0–8.5)
eGFR: 97 mL/min/1.73 (ref >59)

## 2024-10-15 LAB — CERVICOVAGINAL ANCILLARY ONLY
Bacterial Vaginitis (gardnerella): NEGATIVE
Candida Glabrata: NEGATIVE
Candida Vaginitis: POSITIVE — AB
Chlamydia: NEGATIVE
Comment: NEGATIVE
Comment: NEGATIVE
Comment: NEGATIVE
Comment: NEGATIVE
Comment: NEGATIVE
Comment: NORMAL
Neisseria Gonorrhea: NEGATIVE
Trichomonas: NEGATIVE

## 2024-10-15 LAB — CBC WITH DIFFERENTIAL/PLATELET
Basophils Absolute: 0 x10E3/uL (ref 0.0–0.2)
Basos: 1 %
EOS (ABSOLUTE): 0 x10E3/uL (ref 0.0–0.4)
Eos: 1 %
Hematocrit: 42.2 % (ref 34.0–46.6)
Hemoglobin: 13.2 g/dL (ref 11.1–15.9)
Immature Grans (Abs): 0 x10E3/uL (ref 0.0–0.1)
Immature Granulocytes: 0 %
Lymphocytes Absolute: 1.8 x10E3/uL (ref 0.7–3.1)
Lymphs: 38 %
MCH: 26.5 pg — ABNORMAL LOW (ref 26.6–33.0)
MCHC: 31.3 g/dL — ABNORMAL LOW (ref 31.5–35.7)
MCV: 85 fL (ref 79–97)
Monocytes Absolute: 0.4 x10E3/uL (ref 0.1–0.9)
Monocytes: 8 %
Neutrophils Absolute: 2.5 x10E3/uL (ref 1.4–7.0)
Neutrophils: 52 %
Platelets: 242 x10E3/uL (ref 150–450)
RBC: 4.98 x10E6/uL (ref 3.77–5.28)
RDW: 13.3 % (ref 11.7–15.4)
WBC: 4.8 x10E3/uL (ref 3.4–10.8)

## 2024-10-15 LAB — LP+NON-HDL CHOLESTEROL
Cholesterol, Total: 208 mg/dL — ABNORMAL HIGH (ref 100–199)
HDL: 79 mg/dL (ref >39)
LDL Chol Calc (NIH): 121 mg/dL — ABNORMAL HIGH (ref 0–99)
Total Non-HDL-Chol (LDL+VLDL): 129 mg/dL (ref 0–129)
Triglycerides: 43 mg/dL (ref 0–149)
VLDL Cholesterol Cal: 8 mg/dL (ref 5–40)

## 2024-10-15 MED ORDER — FLUCONAZOLE 150 MG PO TABS: 150.0000 mg | ORAL_TABLET | Freq: Once | ORAL | 0 refills | Status: AC

## 2024-10-16 ENCOUNTER — Other Ambulatory Visit: Payer: Self-pay | Admitting: Family Medicine

## 2024-10-16 NOTE — Telephone Encounter (Signed)
 Copied from CRM #[id number]. Topic: Clinical - Medication Refill >> Oct 16, 2024  8:53 AM Charlet HERO wrote: Medication: acetaminophen  (TYLENOL ) 500 MG tablet  Has the patient contacted their pharmacy? No 0 refill  This is the patient's preferred pharmacy:  [id number] [place], [place] - [address] AT [facility] OF [facility] & [address] [place] [address] Phone: [phone] Fax: [phone]  Is this the correct pharmacy for this prescription? Yes If no, delete pharmacy and type the correct one.   Has the prescription been filled recently? Yes  Is the patient out of the medication? Yes  Has the patient been seen for an appointment in the last year OR does the patient have an upcoming appointment? Yes  Can we respond through MyChart? Yes  Agent: Please be advised that Rx refills may take up to 3 business days. We ask that you follow-up with your pharmacy.

## 2024-10-17 NOTE — Telephone Encounter (Signed)
 Requested medications are due for refill today.  Unsure  Requested medications are on the active medications list.  yes  Last refill. unsure  Future visit scheduled.   yes  Notes to clinic.  Per request pt is no longer taking this medication as of 10/14/2024. Medication is historical.    Requested Prescriptions  Pending Prescriptions Disp Refills   acetaminophen  (TYLENOL ) 500 MG tablet 30 tablet     Sig: Take 1 tablet (500 mg total) by mouth every 6 (six) hours as needed for moderate pain (pain score 4-6).     Over the counter: OTC - acetaminophen  Passed - 10/17/2024  5:24 PM      Passed - Cr in normal range and within 360 days    Creatinine, Ser  Date Value Ref Range Status  10/14/2024 0.81 0.57 - 1.00 mg/dL Final         Passed - ALT in normal range and within 360 days    ALT  Date Value Ref Range Status  10/14/2024 17 0 - 32 IU/L Final         Passed - AST in normal range and within 360 days    AST  Date Value Ref Range Status  10/14/2024 18 0 - 40 IU/L Final         Passed - Valid encounter within last 12 months    Recent Outpatient Visits           3 days ago Vaginal discharge   [facility] [place]. [facility] Delbert Clam, MD   1 year ago Dysmenorrhea   [facility] [place]. [facility] Delbert Clam, MD   2 years ago Vaginal discharge   [facility] [place]. [facility] Delbert Clam, MD   2 years ago Vaginal discharge   [facility] Of [place]. [facility] Delbert Clam, MD   3 years ago Urinary tract infection without hematuria, site unspecified   [place] [facility] Of [place]. [facility] [place], [place], PA-C

## 2024-10-19 MED ORDER — ACETAMINOPHEN 500 MG PO TABS: 500.0000 mg | ORAL_TABLET | Freq: Four times a day (QID) | ORAL | 1 refills | Status: AC | PRN

## 2024-10-20 ENCOUNTER — Other Ambulatory Visit: Payer: Self-pay | Admitting: Family Medicine

## 2024-10-20 DIAGNOSIS — N946 Dysmenorrhea, unspecified: Secondary | ICD-10-CM

## 2025-04-07 ENCOUNTER — Ambulatory Visit

## 2025-10-14 ENCOUNTER — Ambulatory Visit: Admitting: Family Medicine
